# Patient Record
Sex: Male | Born: 1965 | Race: White | Hispanic: No | Marital: Single | Smoking: Current every day smoker
Health system: Southern US, Community
[De-identification: ages and names within clinical notes are randomized; demographics above are authoritative.]

## PROBLEM LIST (undated history)

## (undated) ENCOUNTER — Emergency Department (HOSPITAL_COMMUNITY): Admission: EM | Payer: Self-pay | Source: Home / Self Care

## (undated) DIAGNOSIS — E785 Hyperlipidemia, unspecified: Secondary | ICD-10-CM

## (undated) DIAGNOSIS — M792 Neuralgia and neuritis, unspecified: Secondary | ICD-10-CM

## (undated) DIAGNOSIS — R32 Unspecified urinary incontinence: Secondary | ICD-10-CM

## (undated) DIAGNOSIS — Z955 Presence of coronary angioplasty implant and graft: Secondary | ICD-10-CM

## (undated) DIAGNOSIS — M1990 Unspecified osteoarthritis, unspecified site: Secondary | ICD-10-CM

## (undated) DIAGNOSIS — I10 Essential (primary) hypertension: Secondary | ICD-10-CM

## (undated) DIAGNOSIS — K4090 Unilateral inguinal hernia, without obstruction or gangrene, not specified as recurrent: Secondary | ICD-10-CM

## (undated) DIAGNOSIS — Z720 Tobacco use: Secondary | ICD-10-CM

## (undated) DIAGNOSIS — J449 Chronic obstructive pulmonary disease, unspecified: Secondary | ICD-10-CM

## (undated) DIAGNOSIS — G8929 Other chronic pain: Secondary | ICD-10-CM

## (undated) DIAGNOSIS — I2119 ST elevation (STEMI) myocardial infarction involving other coronary artery of inferior wall: Secondary | ICD-10-CM

## (undated) DIAGNOSIS — M545 Low back pain, unspecified: Secondary | ICD-10-CM

## (undated) DIAGNOSIS — L309 Dermatitis, unspecified: Secondary | ICD-10-CM

## (undated) DIAGNOSIS — I25110 Atherosclerotic heart disease of native coronary artery with unstable angina pectoris: Secondary | ICD-10-CM

## (undated) HISTORY — PX: TRANSTHORACIC ECHOCARDIOGRAM: SHX275

## (undated) HISTORY — DX: Unilateral inguinal hernia, without obstruction or gangrene, not specified as recurrent: K40.90

## (undated) HISTORY — DX: Dermatitis, unspecified: L30.9

## (undated) HISTORY — DX: Unspecified urinary incontinence: R32

## (undated) HISTORY — DX: Chronic obstructive pulmonary disease, unspecified: J44.9

## (undated) HISTORY — PX: NO PAST SURGERIES: SHX2092

## (undated) HISTORY — DX: Neuralgia and neuritis, unspecified: M79.2

## (undated) HISTORY — DX: Low back pain, unspecified: M54.50

## (undated) HISTORY — DX: Other chronic pain: G89.29

## (undated) HISTORY — PX: OTHER SURGICAL HISTORY: SHX169

## (undated) HISTORY — DX: Unspecified osteoarthritis, unspecified site: M19.90

---

## 2014-02-17 ENCOUNTER — Emergency Department (HOSPITAL_COMMUNITY)
Admission: EM | Admit: 2014-02-17 | Discharge: 2014-02-17 | Disposition: A | Discharge status: Home / Self Care | Payer: Self-pay | Attending: Emergency Medicine | Admitting: Emergency Medicine

## 2014-02-17 ENCOUNTER — Emergency Department (HOSPITAL_COMMUNITY): Payer: Self-pay

## 2014-02-17 ENCOUNTER — Encounter (HOSPITAL_COMMUNITY): Payer: Self-pay | Admitting: Cardiology

## 2014-02-17 DIAGNOSIS — I10 Essential (primary) hypertension: Secondary | ICD-10-CM | POA: Insufficient documentation

## 2014-02-17 DIAGNOSIS — Z720 Tobacco use: Secondary | ICD-10-CM | POA: Insufficient documentation

## 2014-02-17 DIAGNOSIS — M542 Cervicalgia: Secondary | ICD-10-CM | POA: Insufficient documentation

## 2014-02-17 DIAGNOSIS — F1193 Opioid use, unspecified with withdrawal: Secondary | ICD-10-CM | POA: Insufficient documentation

## 2014-02-17 DIAGNOSIS — Z760 Encounter for issue of repeat prescription: Secondary | ICD-10-CM | POA: Insufficient documentation

## 2014-02-17 DIAGNOSIS — G8929 Other chronic pain: Secondary | ICD-10-CM | POA: Insufficient documentation

## 2014-02-17 DIAGNOSIS — F1123 Opioid dependence with withdrawal: Secondary | ICD-10-CM

## 2014-02-17 DIAGNOSIS — M25552 Pain in left hip: Secondary | ICD-10-CM | POA: Insufficient documentation

## 2014-02-17 DIAGNOSIS — R0789 Other chest pain: Secondary | ICD-10-CM | POA: Insufficient documentation

## 2014-02-17 LAB — BASIC METABOLIC PANEL
Anion gap: 7 (ref 5–15)
BUN: 20 mg/dL (ref 6–23)
CALCIUM: 9.6 mg/dL (ref 8.4–10.5)
CHLORIDE: 106 mmol/L (ref 96–112)
CO2: 24 mmol/L (ref 19–32)
CREATININE: 0.95 mg/dL (ref 0.50–1.35)
Glucose, Bld: 98 mg/dL (ref 70–99)
Potassium: 4.2 mmol/L (ref 3.5–5.1)
SODIUM: 137 mmol/L (ref 135–145)

## 2014-02-17 LAB — CBC
HCT: 44.3 % (ref 39.0–52.0)
Hemoglobin: 15.5 g/dL (ref 13.0–17.0)
MCH: 30.8 pg (ref 26.0–34.0)
MCHC: 35 g/dL (ref 30.0–36.0)
MCV: 87.9 fL (ref 78.0–100.0)
PLATELETS: 244 10*3/uL (ref 150–400)
RBC: 5.04 MIL/uL (ref 4.22–5.81)
RDW: 12.3 % (ref 11.5–15.5)
WBC: 10 10*3/uL (ref 4.0–10.5)

## 2014-02-17 LAB — I-STAT TROPONIN, ED: Troponin i, poc: 0 ng/mL (ref 0.00–0.08)

## 2014-02-17 MED ORDER — HYDROCODONE-ACETAMINOPHEN 5-325 MG PO TABS: 1.0000 | ORAL_TABLET | Freq: Four times a day (QID) | ORAL | Status: DC | PRN

## 2014-02-17 MED ORDER — AEROCHAMBER PLUS W/MASK MISC
1.0000 | Freq: Once | Status: AC
Start: 2014-02-17 — End: 2014-02-17
  Administered 2014-02-17: 1

## 2014-02-17 MED ORDER — ALBUTEROL SULFATE HFA 108 (90 BASE) MCG/ACT IN AERS
2.0000 | INHALATION_SPRAY | RESPIRATORY_TRACT | Status: DC | PRN
  Administered 2014-02-17: 2 via RESPIRATORY_TRACT
  Filled 2014-02-17: qty 6.7

## 2014-02-17 MED ORDER — HYDROCODONE-ACETAMINOPHEN 5-325 MG PO TABS
2.0000 | ORAL_TABLET | Freq: Once | ORAL | Status: AC
  Administered 2014-02-17: 2 via ORAL
  Filled 2014-02-17: qty 2

## 2014-02-17 NOTE — ED Notes (Signed)
Pt in xray

## 2014-02-17 NOTE — ED Provider Notes (Signed)
CSN: [id number]     Arrival date & time 02/17/14  1743 History   First MD Initiated Contact with Patient 02/17/14 1820     Chief Complaint  Patient presents with  . Chest Pain     (Consider location/radiation/quality/duration/timing/severity/associated sxs/prior Treatment) The history is provided by the patient and medical records. No language interpreter was used.     Damon Smith is a 49 y.o. male  with a hx of HTN, chronic neck, left hip and left knee pain presents to the Emergency Department complaining of gradual, persistent, progressively worsening agitation and anxiety onset 1 week ago.  Pt c/o associated chest pain, sharp and "zapping" in nature occuring 1-2x per night for the last 3 nights and last less than one second.  Pt reports he has been managed at a pain management clinic for 2 years, but when he went for his refills last month the clinic was closed. Patient reports he's been trying to cut back on his narcotics but now this morning. He reports significant agitation and feeling anxious.  Patient reports that he is chest pain-free at this time. He reports that his biggest concern is his neck pain, left hip pain and left knee pain, all chronic.  He denies fever, chills, headache, shortness of breath, abdominal pain, nausea, vomiting, diarrhea, weakness, dizziness, syncope, dysuria, hematuria, diaphoresis, rash.   History reviewed. No pertinent past medical history. Past Surgical History  Procedure Laterality Date  . Knee pain     History reviewed. No pertinent family history. History  Substance Use Topics  . Smoking status: Current Every Day Smoker  . Smokeless tobacco: Not on file  . Alcohol Use: No    Review of Systems  Constitutional: Negative for fever, diaphoresis, appetite change, fatigue and unexpected weight change.  HENT: Negative for mouth sores.   Eyes: Negative for visual disturbance.  Respiratory: Negative for cough, chest tightness, shortness of breath  and wheezing.   Cardiovascular: Positive for chest pain.  Gastrointestinal: Negative for nausea, vomiting, abdominal pain, diarrhea and constipation.  Endocrine: Negative for polydipsia, polyphagia and polyuria.  Genitourinary: Negative for dysuria, urgency, frequency and hematuria.  Musculoskeletal: Positive for arthralgias and neck pain. Negative for back pain and neck stiffness.  Skin: Negative for rash and wound.  Allergic/Immunologic: Negative for immunocompromised state.  Neurological: Negative for syncope, light-headedness and headaches.  Hematological: Does not bruise/bleed easily.  Psychiatric/Behavioral: Negative for sleep disturbance. The patient is not nervous/anxious.       Allergies  Review of patient's allergies indicates no known allergies.  Home Medications   Prior to Admission medications   Medication Sig Start Date End Date Taking? Authorizing Provider  HYDROcodone-acetaminophen (NORCO/VICODIN) 5-325 MG per tablet Take 1-2 tablets by mouth every 6 (six) hours as needed for moderate pain. 02/17/14   Eathel Pajak, PA-C   BP 126/77 mmHg  Pulse 72  Temp(Src) 98.3 F (36.8 C) (Oral)  Resp 20  Ht 5\' 11"  (1.803 m)  Wt 201 lb (91.173 kg)  BMI 28.05 kg/m2  SpO2 97% Physical Exam  Constitutional: He appears well-developed and well-nourished. No distress.  Awake, alert, nontoxic appearance  HENT:  Head: Normocephalic and atraumatic.  Mouth/Throat: Oropharynx is clear and moist. No oropharyngeal exudate.  Eyes: Conjunctivae are normal. No scleral icterus.  Neck: Normal range of motion. Neck supple.  Mild bilateral paraspinal tenderness Full range of motion with minimal pain No midline tenderness, step-offs or deformities  Cardiovascular: Normal rate, regular rhythm, normal heart sounds and intact distal pulses.  No murmur heard. Capillary refill < 3 sec  Pulmonary/Chest: Effort normal and breath sounds normal. No respiratory distress. He has no wheezes. He  exhibits no tenderness.  Equal chest expansion Clear and equal breath sounds No tenderness to palpation of the chest  Abdominal: Soft. Bowel sounds are normal. He exhibits no distension and no mass. There is no tenderness. There is no rebound and no guarding.  Soft and nontender  Musculoskeletal: Normal range of motion. He exhibits tenderness. He exhibits no edema.  Full range of motion of bilateral hips knees and ankles Normal gait  Neurological: He is alert. Coordination normal.  Speech is clear and goal oriented Moves extremities without ataxia Sensation intact to bilateral upper and lower extremities Strength 5/5 in bilateral upper and lower extremities with resisted flexion and extension of all major joints  Skin: Skin is warm and dry. He is not diaphoretic.  No tenting of the skin  Psychiatric: He has a normal mood and affect.  Nursing note and vitals reviewed.   ED Course  Procedures (including critical care time) Labs Review Labs Reviewed  CBC  BASIC METABOLIC PANEL  Randolm Idol, ED    Imaging Review Dg Chest 2 View  02/17/2014   CLINICAL DATA:  Chest pain. Pt says he does not have chest pain now but does at night. Dizziness on standing. Pt is a smoker; 1 1/2 packs /day. Hx HTN.  EXAM: CHEST  2 VIEW  COMPARISON:  None.  FINDINGS: The heart size and mediastinal contours are within normal limits. Both lungs are clear. No pleural effusion or pneumothorax. The visualized skeletal structures are unremarkable.  IMPRESSION: No active cardiopulmonary disease.   Electronically Signed   By: Lajean Manes M.D.   On: 02/17/2014 19:00     EKG Interpretation None       ECG:  Date: 02/18/2014  Rate: 95  Rhythm: normal sinus rhythm  QRS Axis: left  Intervals: normal  ST/T Wave abnormalities: normal  Conduction Disutrbances:none  Narrative Interpretation: Nonischemic ECG, no old for comparison  Old EKG Reviewed: none available    MDM   Final diagnoses:  Narcotic  withdrawal  Medication refill  Atypical chest pain  Arthralgia of left hip   [name] presents with narcotic withdrawal symptoms including agitation and atypical chest pain.  Chest pain is not likely of cardiac or pulmonary etiology d/t presentation, PERC negative, VSS, no tracheal deviation, no JVD or new murmur, RRR, breath sounds equal bilaterally, EKG without acute abnormalities, negative troponin, and negative CXR.   Patient without abdominal symptoms from his narcotic withdrawal.  His pain has been treated here in the emergency department and he reports he is feeling much better. He is without tachycardia or hypertension.  He has been given resources for primary care physician and pain management.  Pt has been advised to return to the ED if CP becomes exertional, associated with diaphoresis or nausea, radiates to left jaw/arm, worsens or becomes concerning in any way. Pt appears reliable for follow up and is agreeable to discharge.   Case has been discussed with Dr. Thurnell Garbe who agrees with the above plan to discharge.   I have personally reviewed patient's vitals, nursing note and any pertinent labs or imaging.  I performed an undressed physical exam.    It has been determined that no acute conditions requiring further emergency intervention are present at this time. The patient/guardian have been advised of the diagnosis and plan. I reviewed all labs and imaging including any  potential incidental findings. We have discussed signs and symptoms that warrant return to the ED and they are listed in the discharge instructions.    Vital signs are stable at discharge.   BP 126/77 mmHg  Pulse 72  Temp(Src) 98.3 F (36.8 C) (Oral)  Resp 20  Ht 5\' 11"  (1.803 m)  Wt 201 lb (91.173 kg)  BMI 28.05 kg/m2  SpO2 97%     Abigail Butts, PA-C 02/18/14 [place], DO 02/19/14 1420

## 2014-02-17 NOTE — Discharge Instructions (Signed)
1. Medications: Norco, usual home medications 2. Treatment: rest, drink plenty of fluids,  3. Follow Up: Please followup with the [facility] in 3 days for discussion of your diagnoses and further evaluation after today's visit; if you do not have a primary care doctor use the resource guide provided to find one; Please return to the ER for return of chest pain, SOB, nausea, weakness, syncope or other concerns.  Please also follow-up with the listed pain clinic or use the resource guide to find a pain management center.   Emergency Department Resource Guide 1) Find a Doctor and Pay Out of Pocket Although you won't have to find out who is covered by your insurance plan, it is a good idea to ask around and get recommendations. You will then need to call the office and see if the doctor you have chosen will accept you as a new patient and what types of options they offer for patients who are self-pay. Some doctors offer discounts or will set up payment plans for their patients who do not have insurance, but you will need to ask so you aren't surprised when you get to your appointment.  2) Contact Your Local Health Department Not all health departments have doctors that can see patients for sick visits, but many do, so it is worth a call to see if yours does. If you don't know where your local health department is, you can check in your phone book. The CDC also has a tool to help you locate your state's health department, and many state websites also have listings of all of their local health departments.  3) Find a [place] [facility] If your illness is not likely to be very severe or complicated, you may want to try a walk in clinic. These are popping up all over the country in pharmacies, drugstores, and shopping centers. They're usually staffed by nurse practitioners or physician assistants that have been trained to treat common illnesses and complaints. They're usually fairly quick and inexpensive.  However, if you have serious medical issues or chronic medical problems, these are probably not your best option.  No Primary Care Doctor: - Call Health Connect at  [phone] - they can help you locate a primary care doctor that  accepts your insurance, provides certain services, etc. - Physician Referral Service- [phone]  Chronic Pain Problems: Organization         Address  Phone   Notes  [facility]  [phone] Patients need to be referred by their primary care doctor.   Medication Assistance: Organization         Address  Phone   Notes  [place] [facility] [address] [place], [place] [address] [phone] --Must be a resident of [facility] -- Must have NO insurance coverage whatsoever (no Medicaid/ Medicare, etc.) -- The pt. MUST have a primary care doctor that directs their care regularly and follows them in the community   MedAssist  [phone]   [employer]  [phone]    Agencies that provide inexpensive medical care: Organization         Address  Phone   Notes  [place]  [phone]   [facility]    [phone]   [facility] [place], [place] [address] [phone]   [place] [address] [place] [phone]   Planned  Parenthood    [phone]   [facility]    [phone]   Community Health and [place] [address], [place] Phone:  [phone], Fax:  [phone] Hours of Operation:  9 am - 6 pm, M-F.  Also accepts Medicaid/Medicare and self-pay.  [facility] for [place], [address], [place] Phone: [phone], Fax: [phone]. Hours of Operation:  8:30 am - 5:30 pm, M-F.  Also accepts Medicaid and self-pay.  [facility] [place] [address], [place] Phone: [phone]   [place], [place], [place] [phone], Ext. [phone] Mondays & Thursdays: 7-9 AM.  First 15 patients are seen on a first come, first serve basis.    [employer] Providers:  Organization         Address  Phone   Notes  [facility] [address], [place] [phone] Also accepts self-pay patients.  [facility] [address] [place], [place]  [phone]   [place], [address], [place] [phone]   [facility] [address] [place] [phone]   Lucianne Lei [address], [place]   [phone] Only accepts [place] Access [place] patients after they have their name applied to their card.   Self-Pay (no insurance) in [facility]:  Organization         Address  Phone   Notes  Sickle Cell Patients, [facility] [place] [phone]   [facility] [place] [phone]   [facility] [place]  [place], [place], [place] [phone]   [facility]/Dr. Osei-Bonsu  [address] [place] or [address], [place] [phone] Phone number for both [place] and [place] locations is the same.  Urgent Medical and [facility] [address], [place] [phone]   [facility] [address] [place] or [address] [phone]   [facility] [address], [place] [phone], phone; [phone], fax Sees patients 1st and 3rd Saturday of every month.  Must not qualify for public or private insurance (i.e. Medicaid, Medicare, [place] Health Choice, Veterans' Benefits)  Household income should be no more than 200% of the poverty level The clinic cannot treat you if you are pregnant or think  you are pregnant  Sexually transmitted diseases are not treated at the clinic.    Dental Care: Organization         Address  Phone  Notes  [facility] of [facility] [place] [phone] Accepts children up to age 44 who are enrolled in [place] or [place]; pregnant women with a Medicaid card; and children who have applied for Medicaid or [place] Health Choice, but were declined, whose parents can pay a reduced fee at time of service.  [facility] Department of [facility]  [address], [place] [phone] Accepts children up to age 23 who are enrolled in [place] or [place]; pregnant women with a Medicaid card; and children who have applied for Medicaid or [place], but were declined, whose  parents can pay a reduced fee at time of service.  [place] Adult Dental Access PROGRAM  [place] [phone] Patients are seen by appointment only. Walk-ins are not accepted. [place] will see patients 33 years of age and older. Monday - Tuesday (8am-5pm) Most Wednesdays (8:30-5pm) $30 per visit, cash only  [facility] Adult Dental Access PROGRAM  [address], [facility] [phone] Patients are seen by appointment only. Walk-ins are not accepted. [place] will see patients 36 years of age and older. One Wednesday Evening (Monthly: Volunteer Based).  $30 per visit, cash only  [place]  [phone] for adults; Children under age 3, call Graduate Pediatric Dentistry at [phone]. Children aged 67-14, please call [phone] to request a pediatric application.  Dental services are provided in all areas of dental care including fillings, crowns and bridges, complete and partial dentures, implants, gum treatment, root canals, and extractions. Preventive care is also provided. Treatment is provided to both adults and  children. Patients are selected via a lottery and there is often a waiting list.   [facility] [address], [place]  [phone] [email or web address]   [facility] [address] [place], [place] [phone], Ext. [phone] Second and Fourth Thursday of each month, opens at 6:30 AM; Clinic ends at 9 AM.  Patients are seen on a first-come first-served basis, and a limited number are seen during each clinic.   [facility]  [address] [place], [place] [phone]   Eligibility Requirements You must have lived in [place], [place], or [place] counties for at least the last three months.   You cannot be eligible for state or federal sponsored [employer], including [employer], [place], or [employer].   You generally cannot be eligible for healthcare insurance through your employer.    How to apply: Eligibility screenings are held every Tuesday and Wednesday afternoon from 1:00 pm until 4:00 pm. You do not need an appointment for the interview!  [facility] [address] [place], [place]   [place]  [place] Department  [place]  [phone]    Behavioral Health Resources in the Community: Intensive Outpatient Programs Organization         Address  Phone  Notes  [place]. [address], [place], [place] [phone]   [facility] [address] [place], [place]   ADS: Alcohol & Drug Svcs [address] [place], [place]   [place] [address]  [place], [place] or [phone]   Substance Abuse Resources Organization         Address  Phone  Notes  Alcohol and Drug Services  [phone]   [place]  [phone]   [facility] [place]     Chinita Pester  [phone]   Residential & Outpatient Substance Abuse Program  [phone]   Psychological Services Organization         Address  Phone  Notes  [facility] [place]  [place]  [phone]   [place] [address], [place] or [phone]    Mobile Crisis Teams Organization         Address  Phone  Notes  Therapeutic Alternatives, Mobile  Crisis Care Unit  [phone]   Assertive Psychotherapeutic Services  [address] [place], [place]   [facility] [address], [place] [phone]    Self-Help/Support Groups Organization         Address  Phone             Notes  [place]. of [place] - variety of support groups  [place] Call for more information  Narcotics Anonymous (NA), [facility] [address], [employer] [place]  2 meetings at this location   Special educational needs teacher         Address  Phone  Notes  ASAP Residential Treatment [place],    [place]  [phone]   [facility]  [address], [place] [phone], [place], [place]   [place], [place] [phone] Admissions: 8am-3pm M-F  Incentives Substance [place] [address]    [place], [place] [phone]   [facility] [address] [place], [place], [place]   [facility] [address],  [place], [place]   [facility] Programs - Intensive Outpatient [address], [place], [place]   [facility].,  [place], [place] [phone] or [phone]   [facility] Services (RTS) [address], [place], [place] Accepts Medicaid  [facility] [place] [address]  [place] [phone] Substance Abuse/Addiction Treatment   [facility] Organization         Address  Phone  Notes  [facility]  [phone]   Domenic Schwab, PhD [address] [place], [place]   [phone] or [phone]   [place], [place] [phone]   [facility] [address] [place], [place] [phone] Insurance/Medicaid/sponsorship through [facility] and Families [address], [address] [place]                                    [place], [place] [phone] [place] [address], [place] [phone]    Dr. Adele Schilder  [phone]   [facility] [place] Dept. 1) [address], [place] 2) [place] 3)  [place] [address], [place] [phone]  [phone]   [place] [phone] or [phone] (After Hours)

## 2014-02-17 NOTE — ED Notes (Signed)
DISCHARGED VITALS COMPLETE.

## 2014-02-17 NOTE — ED Notes (Signed)
Pt reports some chest pain and anxiety over the past couple of evenings. Reports that he has been out of his pain medication for the past couple of days and thinks that may have been contributing to the anxiety.

## 2014-10-16 ENCOUNTER — Inpatient Hospital Stay (HOSPITAL_COMMUNITY)
Admission: EM | Admit: 2014-10-16 | Discharge: 2014-10-18 | DRG: 247 | Disposition: A | Discharge status: Home / Self Care | Payer: Self-pay | Source: Intra-hospital | Attending: Interventional Cardiology | Admitting: Interventional Cardiology

## 2014-10-16 ENCOUNTER — Encounter (HOSPITAL_COMMUNITY)
Admission: EM | Disposition: A | Discharge status: Home / Self Care | Payer: Self-pay | Attending: Interventional Cardiology

## 2014-10-16 ENCOUNTER — Encounter (HOSPITAL_COMMUNITY): Payer: Self-pay | Admitting: Physician Assistant

## 2014-10-16 DIAGNOSIS — Z8249 Family history of ischemic heart disease and other diseases of the circulatory system: Secondary | ICD-10-CM

## 2014-10-16 DIAGNOSIS — I25110 Atherosclerotic heart disease of native coronary artery with unstable angina pectoris: Secondary | ICD-10-CM | POA: Diagnosis present

## 2014-10-16 DIAGNOSIS — I10 Essential (primary) hypertension: Secondary | ICD-10-CM | POA: Diagnosis present

## 2014-10-16 DIAGNOSIS — F17210 Nicotine dependence, cigarettes, uncomplicated: Secondary | ICD-10-CM | POA: Diagnosis present

## 2014-10-16 DIAGNOSIS — Z716 Tobacco abuse counseling: Secondary | ICD-10-CM | POA: Diagnosis present

## 2014-10-16 DIAGNOSIS — I25119 Atherosclerotic heart disease of native coronary artery with unspecified angina pectoris: Secondary | ICD-10-CM | POA: Clinically undetermined

## 2014-10-16 DIAGNOSIS — Z720 Tobacco use: Secondary | ICD-10-CM | POA: Diagnosis present

## 2014-10-16 DIAGNOSIS — I2510 Atherosclerotic heart disease of native coronary artery without angina pectoris: Secondary | ICD-10-CM

## 2014-10-16 DIAGNOSIS — I2111 ST elevation (STEMI) myocardial infarction involving right coronary artery: Secondary | ICD-10-CM

## 2014-10-16 DIAGNOSIS — I2119 ST elevation (STEMI) myocardial infarction involving other coronary artery of inferior wall: Principal | ICD-10-CM | POA: Diagnosis present

## 2014-10-16 DIAGNOSIS — Z955 Presence of coronary angioplasty implant and graft: Secondary | ICD-10-CM

## 2014-10-16 DIAGNOSIS — G8929 Other chronic pain: Secondary | ICD-10-CM | POA: Diagnosis present

## 2014-10-16 DIAGNOSIS — E785 Hyperlipidemia, unspecified: Secondary | ICD-10-CM | POA: Diagnosis present

## 2014-10-16 HISTORY — DX: Hyperlipidemia, unspecified: E78.5

## 2014-10-16 HISTORY — DX: Tobacco use: Z72.0

## 2014-10-16 HISTORY — DX: ST elevation (STEMI) myocardial infarction involving other coronary artery of inferior wall: I21.19

## 2014-10-16 HISTORY — DX: Presence of coronary angioplasty implant and graft: Z95.5

## 2014-10-16 HISTORY — DX: Other chronic pain: G89.29

## 2014-10-16 HISTORY — DX: Essential (primary) hypertension: I10

## 2014-10-16 HISTORY — DX: Atherosclerotic heart disease of native coronary artery with unstable angina pectoris: I25.110

## 2014-10-16 HISTORY — PX: CARDIAC CATHETERIZATION: SHX172

## 2014-10-16 LAB — POCT I-STAT, CHEM 8
BUN: 9 mg/dL (ref 6–20)
CALCIUM ION: 1.16 mmol/L (ref 1.12–1.23)
CREATININE: 0.4 mg/dL — AB (ref 0.61–1.24)
Chloride: 79 mmol/L — ABNORMAL LOW (ref 101–111)
GLUCOSE: 67 mg/dL (ref 65–99)
HCT: 37 % — ABNORMAL LOW (ref 39.0–52.0)
HEMOGLOBIN: 12.6 g/dL — AB (ref 13.0–17.0)
Potassium: 3.2 mmol/L — ABNORMAL LOW (ref 3.5–5.1)
Sodium: 115 mmol/L — CL (ref 135–145)
TCO2: 17 mmol/L (ref 0–100)

## 2014-10-16 LAB — CBC
HEMATOCRIT: 40.7 % (ref 39.0–52.0)
HEMOGLOBIN: 13.9 g/dL (ref 13.0–17.0)
MCH: 30.6 pg (ref 26.0–34.0)
MCHC: 34.2 g/dL (ref 30.0–36.0)
MCV: 89.6 fL (ref 78.0–100.0)
Platelets: 260 10*3/uL (ref 150–400)
RBC: 4.54 MIL/uL (ref 4.22–5.81)
RDW: 12.5 % (ref 11.5–15.5)
WBC: 11.5 10*3/uL — ABNORMAL HIGH (ref 4.0–10.5)

## 2014-10-16 LAB — POCT ACTIVATED CLOTTING TIME: ACTIVATED CLOTTING TIME: 460 s

## 2014-10-16 LAB — CREATININE, SERUM
Creatinine, Ser: 0.96 mg/dL (ref 0.61–1.24)
GFR calc Af Amer: >60 mL/min (ref >60)

## 2014-10-16 LAB — MRSA PCR SCREENING: MRSA by PCR: NEGATIVE

## 2014-10-16 LAB — TROPONIN I: Troponin I: 1.37 ng/mL (ref <0.031)

## 2014-10-16 SURGERY — LEFT HEART CATH AND CORONARY ANGIOGRAPHY
Anesthesia: LOCAL

## 2014-10-16 MED ORDER — NITROGLYCERIN 1 MG/10 ML FOR IR/CATH LAB
INTRA_ARTERIAL | Status: AC
  Filled 2014-10-16: qty 10

## 2014-10-16 MED ORDER — SODIUM CHLORIDE 0.9 % IJ SOLN
3.0000 mL | Freq: Two times a day (BID) | INTRAMUSCULAR | Status: DC
  Administered 2014-10-16 – 2014-10-17 (×3): 3 mL via INTRAVENOUS

## 2014-10-16 MED ORDER — LIDOCAINE HCL (PF) 1 % IJ SOLN
INTRAMUSCULAR | Status: DC | PRN
  Administered 2014-10-16: 5 mL

## 2014-10-16 MED ORDER — INFLUENZA VAC SPLIT QUAD 0.5 ML IM SUSY
0.5000 mL | PREFILLED_SYRINGE | INTRAMUSCULAR | Status: DC
  Filled 2014-10-16: qty 0.5

## 2014-10-16 MED ORDER — NITROGLYCERIN 0.4 MG SL SUBL: 0.4000 mg | SUBLINGUAL_TABLET | SUBLINGUAL | Status: DC | PRN

## 2014-10-16 MED ORDER — BIVALIRUDIN BOLUS VIA INFUSION - CUPID
INTRAVENOUS | Status: DC | PRN
  Administered 2014-10-16: 68.25 mg via INTRAVENOUS

## 2014-10-16 MED ORDER — VERAPAMIL HCL 2.5 MG/ML IV SOLN
INTRAVENOUS | Status: DC | PRN
  Administered 2014-10-16: 10 mL via INTRA_ARTERIAL

## 2014-10-16 MED ORDER — LIDOCAINE HCL (PF) 1 % IJ SOLN
INTRAMUSCULAR | Status: AC
  Filled 2014-10-16: qty 30

## 2014-10-16 MED ORDER — ACETAMINOPHEN 325 MG PO TABS: 650.0000 mg | ORAL_TABLET | ORAL | Status: DC | PRN

## 2014-10-16 MED ORDER — ENOXAPARIN SODIUM 40 MG/0.4ML ~~LOC~~ SOLN
40.0000 mg | SUBCUTANEOUS | Status: DC
  Administered 2014-10-17: 40 mg via SUBCUTANEOUS
  Filled 2014-10-16: qty 0.4

## 2014-10-16 MED ORDER — OXYCODONE-ACETAMINOPHEN 5-325 MG PO TABS
1.0000 | ORAL_TABLET | ORAL | Status: DC | PRN
  Administered 2014-10-16: 2 via ORAL
  Filled 2014-10-16: qty 2

## 2014-10-16 MED ORDER — FENTANYL CITRATE (PF) 100 MCG/2ML IJ SOLN
INTRAMUSCULAR | Status: AC
  Filled 2014-10-16: qty 4

## 2014-10-16 MED ORDER — SODIUM CHLORIDE 0.9 % WEIGHT BASED INFUSION: 1.0000 mL/kg/h | INTRAVENOUS | Status: AC

## 2014-10-16 MED ORDER — NITROGLYCERIN 1 MG/10 ML FOR IR/CATH LAB
INTRA_ARTERIAL | Status: DC | PRN
  Administered 2014-10-16: 100 ug via INTRACORONARY

## 2014-10-16 MED ORDER — HYDROCODONE-ACETAMINOPHEN 5-325 MG PO TABS
1.0000 | ORAL_TABLET | Freq: Four times a day (QID) | ORAL | Status: DC | PRN
  Administered 2014-10-16 – 2014-10-18 (×7): 2 via ORAL
  Filled 2014-10-16 (×7): qty 2

## 2014-10-16 MED ORDER — ATORVASTATIN CALCIUM 80 MG PO TABS
80.0000 mg | ORAL_TABLET | Freq: Every day | ORAL | Status: DC
  Administered 2014-10-17: 80 mg via ORAL
  Filled 2014-10-16: qty 1

## 2014-10-16 MED ORDER — HEPARIN SODIUM (PORCINE) 1000 UNIT/ML IJ SOLN
INTRAMUSCULAR | Status: AC
  Filled 2014-10-16: qty 1

## 2014-10-16 MED ORDER — ATORVASTATIN CALCIUM 80 MG PO TABS: 80.0000 mg | ORAL_TABLET | Freq: Every day | ORAL | Status: DC

## 2014-10-16 MED ORDER — NITROGLYCERIN IN D5W 200-5 MCG/ML-% IV SOLN
INTRAVENOUS | Status: DC | PRN
  Administered 2014-10-16: 10 ug/min via INTRAVENOUS

## 2014-10-16 MED ORDER — TICAGRELOR 90 MG PO TABS
ORAL_TABLET | ORAL | Status: DC | PRN
  Administered 2014-10-16: 180 mg via ORAL

## 2014-10-16 MED ORDER — SODIUM CHLORIDE 0.9 % IV SOLN
250.0000 mg | INTRAVENOUS | Status: DC | PRN
  Administered 2014-10-16: 1.75 mg/kg/h via INTRAVENOUS

## 2014-10-16 MED ORDER — ONDANSETRON HCL 4 MG/2ML IJ SOLN: 4.0000 mg | Freq: Four times a day (QID) | INTRAMUSCULAR | Status: DC | PRN

## 2014-10-16 MED ORDER — MIDAZOLAM HCL 2 MG/2ML IJ SOLN
INTRAMUSCULAR | Status: DC | PRN
  Administered 2014-10-16 (×2): 1 mg via INTRAVENOUS

## 2014-10-16 MED ORDER — PNEUMOCOCCAL VAC POLYVALENT 25 MCG/0.5ML IJ INJ
0.5000 mL | INJECTION | INTRAMUSCULAR | Status: AC
  Administered 2014-10-17: 0.5 mL via INTRAMUSCULAR
  Filled 2014-10-16: qty 0.5

## 2014-10-16 MED ORDER — NITROGLYCERIN IN D5W 200-5 MCG/ML-% IV SOLN: 20.0000 ug/min | INTRAVENOUS | Status: AC

## 2014-10-16 MED ORDER — SODIUM CHLORIDE 0.9 % IV SOLN: 250.0000 mL | INTRAVENOUS | Status: DC | PRN

## 2014-10-16 MED ORDER — NITROGLYCERIN IN D5W 200-5 MCG/ML-% IV SOLN
INTRAVENOUS | Status: AC
  Filled 2014-10-16: qty 250

## 2014-10-16 MED ORDER — MIDAZOLAM HCL 2 MG/2ML IJ SOLN
INTRAMUSCULAR | Status: AC
  Filled 2014-10-16: qty 4

## 2014-10-16 MED ORDER — TICAGRELOR 90 MG PO TABS
90.0000 mg | ORAL_TABLET | Freq: Two times a day (BID) | ORAL | Status: DC
  Administered 2014-10-17 – 2014-10-18 (×3): 90 mg via ORAL
  Filled 2014-10-16 (×4): qty 1

## 2014-10-16 MED ORDER — VERAPAMIL HCL 2.5 MG/ML IV SOLN
INTRAVENOUS | Status: AC
  Filled 2014-10-16: qty 2

## 2014-10-16 MED ORDER — METOPROLOL TARTRATE 12.5 MG HALF TABLET
12.5000 mg | ORAL_TABLET | Freq: Two times a day (BID) | ORAL | Status: DC
  Administered 2014-10-16: 12.5 mg via ORAL
  Filled 2014-10-16: qty 1

## 2014-10-16 MED ORDER — IOHEXOL 350 MG/ML SOLN
INTRAVENOUS | Status: DC | PRN
  Administered 2014-10-16: 165 mL via INTRAVENOUS

## 2014-10-16 MED ORDER — ASPIRIN EC 81 MG PO TBEC
81.0000 mg | DELAYED_RELEASE_TABLET | Freq: Every day | ORAL | Status: DC
  Administered 2014-10-17 – 2014-10-18 (×2): 81 mg via ORAL
  Filled 2014-10-16 (×2): qty 1

## 2014-10-16 MED ORDER — BIVALIRUDIN 250 MG IV SOLR
INTRAVENOUS | Status: AC
  Filled 2014-10-16: qty 250

## 2014-10-16 MED ORDER — ASPIRIN 81 MG PO CHEW: 81.0000 mg | CHEWABLE_TABLET | Freq: Every day | ORAL | Status: DC

## 2014-10-16 MED ORDER — FENTANYL CITRATE (PF) 100 MCG/2ML IJ SOLN
INTRAMUSCULAR | Status: DC | PRN
  Administered 2014-10-16 (×2): 50 ug via INTRAVENOUS

## 2014-10-16 MED ORDER — SODIUM CHLORIDE 0.9 % IJ SOLN
3.0000 mL | INTRAMUSCULAR | Status: DC | PRN
  Administered 2014-10-17: 3 mL via INTRAVENOUS
  Filled 2014-10-16: qty 3

## 2014-10-16 SURGICAL SUPPLY — 16 items
BALLN EMERGE MR 3.0X15 (BALLOONS) ×2
BALLN ~~LOC~~ EUPHORA RX 4.5X15 (BALLOONS) ×2
BALLOON EMERGE MR 3.0X15 (BALLOONS) IMPLANT
BALLOON ~~LOC~~ EUPHORA RX 4.5X15 (BALLOONS) IMPLANT
CATH INFINITI 5 FR JL3.5 (CATHETERS) ×2 IMPLANT
DEVICE RAD COMP TR BAND LRG (VASCULAR PRODUCTS) ×2 IMPLANT
GLIDESHEATH SLEND A-KIT 6F 22G (SHEATH) ×2 IMPLANT
GUIDE CATH RUNWAY 6FR FR4 (CATHETERS) ×1 IMPLANT
KIT ENCORE 26 ADVANTAGE (KITS) ×1 IMPLANT
KIT HEART LEFT (KITS) ×2 IMPLANT
PACK CARDIAC CATHETERIZATION (CUSTOM PROCEDURE TRAY) ×2 IMPLANT
STENT SYNERGY DES 4X28 (Permanent Stent) ×1 IMPLANT
TRANSDUCER W/STOPCOCK (MISCELLANEOUS) ×2 IMPLANT
TUBING CIL FLEX 10 FLL-RA (TUBING) ×2 IMPLANT
WIRE ASAHI PROWATER 180CM (WIRE) ×1 IMPLANT
WIRE SAFE-T 1.5MM-J .035X260CM (WIRE) ×2 IMPLANT

## 2014-10-16 NOTE — Progress Notes (Signed)
CRITICAL VALUE ALERT  Critical value received:  Troponin 1.37  Date of notification:  10/16/2014   Time of notification:  7:53 PM   Critical value read back:Yes.    Nurse who received alert:  Johnsie Cancel   MD notified (1st page):  Expected value, MD aware  Time of first page:    MD notified (2nd page):  Time of second page:  Responding MD:    Time MD responded:

## 2014-10-16 NOTE — H&P (Signed)
Patient ID: Damon Smith MRN: [id number], DOB/AGE: 47-Mar-1967   Admit date: 10/16/2014  Primary Physician: No primary care provider on file. Primary Cardiologist: Dr. Tamala Julian  Pt. Profile:  49 year old Caucasian male with past medical history of hypertension and chronic knee pain, but no prior cardiac history presented as inferior STEMI.  Problem List  Past Medical History  Diagnosis Date  . Hypertension     Past Surgical History  Procedure Laterality Date  . Knee pain    . No past surgeries       Allergies  No Known Allergies  HPI  The patient is a 50 year old Caucasian male with past medical history of hypertension and chronic knee pain, but no prior cardiac history. His father had first MI in his 77s. He has been smoking 2 packs a day. He quit drinking 3 years ago. He denies any illicit drug use.   He presented to [name] on 10/16/2014 as acute inferior STEMI. Per patient, he has been having intermittent substernal chest discomfort for the past 5-6 days which he thought was indigestion. The chest discomfort increasing frequency starting on 9/26. He states he had a roughly 7-8 episodes of chest discomfort throughout the night. In the afternoon of 9/27, while he was lifting heavy object in the house he started having significant amount of chest discomfort and nausea which prompted the patient to seek medical attention. He arrived at [facility] 32min later. En route to [facility], he received 6 mg morphine, 3 nitroglycerin, 4 baby aspirin. Of note, patient denies any history of significant bleeding or stroke. He did have some black stools yesterday after taking Pepto-Bismol.  On arrival to the hospital, EKG showed ST elevation in the inferior lead with corresponding ST depression in the septal lead. He was taken straight to the [facility] cath lab. On arrival, he still have about 3 out of 10 chest pain. It was felt that his black stool was likely related to   Pepto-Bismol use, he has no obvious contraindication to drug eluding stent. Labs currently pending  Home Medications  Prior to Admission medications   Medication Sig Start Date End Date Taking? Authorizing Provider  HYDROcodone-acetaminophen (NORCO/VICODIN) 5-325 MG per tablet Take 1-2 tablets by mouth every 6 (six) hours as needed for moderate pain. 02/17/14   Jarrett Soho Muthersbaugh, PA-C    Family History  Family History  Problem Relation Age of Onset  . Heart attack Father 51    Social History  Social History   Social History  . Marital Status: Single    Spouse Name: N/A  . Number of Children: N/A  . Years of Education: N/A   Occupational History  . Not on file.   Social History Main Topics  . Smoking status: Current Every Day Smoker  . Smokeless tobacco: Not on file     Comment: 2 ppd  . Alcohol Use: No  . Drug Use: No  . Sexual Activity: Not on file   Other Topics Concern  . Not on file   Social History Narrative     Review of Systems General:  No chills, fever, night sweats or weight changes.  Cardiovascular:  No dyspnea on exertion, edema, orthopnea, palpitations, paroxysmal nocturnal dyspnea. +chest pain Dermatological: No rash, lesions/masses Respiratory: No cough, dyspnea Urologic: No hematuria, dysuria Abdominal:   No vomiting, diarrhea, bright red blood per rectum, melena, or hematemesis +nausea Neurologic:  No visual changes, wkns, changes in mental status. All other systems reviewed and are  otherwise negative except as noted above.  Physical Exam  There were no vitals taken for this visit.  General: Pleasant, NAD Psych: Normal affect. Neuro: Alert and oriented X 3. Moves all extremities spontaneously. HEENT: Normal  Neck: Supple without bruits or JVD. Lungs:  Resp regular and unlabored, CTA. Heart: RRR no s3, s4, or murmurs. Abdomen: Soft, non-tender, non-distended, BS + x 4.  Extremities: No clubbing, cyanosis or edema. DP/PT/Radials 2+ and  equal bilaterally.  Labs  Troponin (Point of Care Test) No results for input(s): TROPIPOC in the last 72 hours. No results for input(s): CKTOTAL, CKMB, TROPONINI in the last 72 hours. Lab Results  Component Value Date   WBC 10.0 02/17/2014   HGB 15.5 02/17/2014   HCT 44.3 02/17/2014   MCV 87.9 02/17/2014   PLT 244 02/17/2014   No results for input(s): NA, K, CL, CO2, BUN, CREATININE, CALCIUM, PROT, BILITOT, ALKPHOS, ALT, AST, GLUCOSE in the last 168 hours.  Invalid input(s): LABALBU No results found for: CHOL, HDL, LDLCALC, TRIG No results found for: DDIMER    ECG  Greater than 2 mm ST elevation in the inferior lead with corresponding ST depression in the septal anterior lead.   ASSESSMENT AND PLAN  1. Inferior STEMI  - Patient undergo emergent cardiac catheterization, no obvious contraindication to drug-eluting stent, his black stool was felt to be related to Pepto-Bismol use   - admit to ICU, septal enzymes, obtain echocardiogram.   - Obtain lipid panel and hgb A1c in the morning.   2. HTN   Signed, Almyra Deforest, PA-C 10/16/2014, 5:38 PM&p

## 2014-10-17 ENCOUNTER — Encounter (HOSPITAL_COMMUNITY): Payer: Self-pay | Admitting: Interventional Cardiology

## 2014-10-17 ENCOUNTER — Ambulatory Visit (HOSPITAL_COMMUNITY): Payer: Self-pay

## 2014-10-17 DIAGNOSIS — I10 Essential (primary) hypertension: Secondary | ICD-10-CM

## 2014-10-17 DIAGNOSIS — I25119 Atherosclerotic heart disease of native coronary artery with unspecified angina pectoris: Secondary | ICD-10-CM | POA: Clinically undetermined

## 2014-10-17 DIAGNOSIS — I2119 ST elevation (STEMI) myocardial infarction involving other coronary artery of inferior wall: Secondary | ICD-10-CM

## 2014-10-17 DIAGNOSIS — Z955 Presence of coronary angioplasty implant and graft: Secondary | ICD-10-CM

## 2014-10-17 DIAGNOSIS — I25110 Atherosclerotic heart disease of native coronary artery with unstable angina pectoris: Secondary | ICD-10-CM

## 2014-10-17 HISTORY — DX: Atherosclerotic heart disease of native coronary artery with unstable angina pectoris: I25.110

## 2014-10-17 HISTORY — DX: Presence of coronary angioplasty implant and graft: Z95.5

## 2014-10-17 HISTORY — DX: Essential (primary) hypertension: I10

## 2014-10-17 LAB — BASIC METABOLIC PANEL
ANION GAP: 6 (ref 5–15)
Anion gap: 10 (ref 5–15)
BUN: 8 mg/dL (ref 6–20)
BUN: 6 mg/dL (ref 6–20)
CALCIUM: 8.8 mg/dL — AB (ref 8.9–10.3)
CALCIUM: 8.7 mg/dL — AB (ref 8.9–10.3)
CO2: 21 mmol/L — ABNORMAL LOW (ref 22–32)
CO2: 25 mmol/L (ref 22–32)
CREATININE: 0.92 mg/dL (ref 0.61–1.24)
Chloride: 104 mmol/L (ref 101–111)
Chloride: 104 mmol/L (ref 101–111)
Creatinine, Ser: 0.94 mg/dL (ref 0.61–1.24)
GFR calc Af Amer: >60 mL/min (ref >60)
GLUCOSE: 107 mg/dL — AB (ref 65–99)
Glucose, Bld: 107 mg/dL — ABNORMAL HIGH (ref 65–99)
POTASSIUM: 3.6 mmol/L (ref 3.5–5.1)
Potassium: 3.6 mmol/L (ref 3.5–5.1)
SODIUM: 135 mmol/L (ref 135–145)
Sodium: 135 mmol/L (ref 135–145)

## 2014-10-17 LAB — TROPONIN I
Troponin I: 6.7 ng/mL (ref <0.031)
Troponin I: 5.77 ng/mL (ref <0.031)
Troponin I: 3.8 ng/mL (ref <0.031)
Troponin I: 3.69 ng/mL (ref <0.031)

## 2014-10-17 LAB — CBC
HEMATOCRIT: 38 % — AB (ref 39.0–52.0)
HEMOGLOBIN: 12.5 g/dL — AB (ref 13.0–17.0)
MCH: 29.8 pg (ref 26.0–34.0)
MCHC: 32.9 g/dL (ref 30.0–36.0)
MCV: 90.7 fL (ref 78.0–100.0)
Platelets: 230 10*3/uL (ref 150–400)
RBC: 4.19 MIL/uL — AB (ref 4.22–5.81)
RDW: 12.7 % (ref 11.5–15.5)
WBC: 10 10*3/uL (ref 4.0–10.5)

## 2014-10-17 LAB — LIPID PANEL
CHOL/HDL RATIO: 7 ratio
CHOLESTEROL: 174 mg/dL (ref 0–200)
HDL: 25 mg/dL — AB (ref >40)
LDL CALC: 107 mg/dL — AB (ref 0–99)
TRIGLYCERIDES: 210 mg/dL — AB (ref <150)
VLDL: 42 mg/dL — AB (ref 0–40)

## 2014-10-17 MED ORDER — METOPROLOL TARTRATE 25 MG PO TABS
25.0000 mg | ORAL_TABLET | Freq: Two times a day (BID) | ORAL | Status: DC
  Administered 2014-10-17 – 2014-10-18 (×3): 25 mg via ORAL
  Filled 2014-10-17 (×3): qty 1

## 2014-10-17 MED ORDER — LISINOPRIL 5 MG PO TABS
5.0000 mg | ORAL_TABLET | Freq: Every day | ORAL | Status: DC
  Administered 2014-10-17 – 2014-10-18 (×2): 5 mg via ORAL
  Filled 2014-10-17 (×2): qty 1

## 2014-10-17 MED ORDER — LORAZEPAM 1 MG PO TABS
1.0000 mg | ORAL_TABLET | Freq: Three times a day (TID) | ORAL | Status: DC | PRN
  Administered 2014-10-17 (×2): 1 mg via ORAL
  Filled 2014-10-17 (×2): qty 1

## 2014-10-17 MED ORDER — ZOLPIDEM TARTRATE 5 MG PO TABS
5.0000 mg | ORAL_TABLET | Freq: Every evening | ORAL | Status: DC | PRN
  Administered 2014-10-17: 5 mg via ORAL
  Filled 2014-10-17: qty 1

## 2014-10-17 MED ORDER — NICOTINE 21 MG/24HR TD PT24
21.0000 mg | MEDICATED_PATCH | Freq: Every day | TRANSDERMAL | Status: DC
  Administered 2014-10-17 – 2014-10-18 (×2): 21 mg via TRANSDERMAL
  Filled 2014-10-17 (×2): qty 1

## 2014-10-17 NOTE — Care Management Note (Signed)
Case Management Note  Patient Details  Name: Damon Smith MRN: [id number] Date of Birth: [birth year]  Subjective/Objective:    Adm w mi               Action/Plan: lives w friend   Expected Discharge Date:                  Expected Discharge Plan:  Home/Self Care  In-House Referral:     Discharge planning Services  CM Consult, Medication Assistance, [facility]  Post Acute Care Choice:    Choice offered to:     DME Arranged:    DME Agency:     HH Arranged:    [place]:     Status of Service:     Medicare Important Message Given:    Date Medicare IM Given:    Medicare IM give by:    Date Additional Medicare IM Given:    Additional Medicare Important Message give by:     If discussed at [place] of Stay Meetings, dates discussed:    Additional Comments: ur review done. Pt lives in [place] , no ins. He goes to pain clinic in [place] but has not pcp. Gave him inform on [facility] and [facility]. Gave pt 30day free brilinta card. Left pt assist form on shadow chart. Pt aware after md signs he will need to send in w proof of income.  Lacretia Leigh, RN 10/17/2014, 9:20 AM

## 2014-10-17 NOTE — Progress Notes (Signed)
  Echocardiogram 2D Echocardiogram has been performed.  Donata Clay 10/17/2014, 10:26 AM

## 2014-10-17 NOTE — Progress Notes (Addendum)
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 Tamala Julian Patient Profile: 49 yo male w/ PMH of HTN and tobacco abuse with no prior cardiac history who presented to [facility] on 10/16/2014 as an acute inferior STEMI.  SUBJECTIVE: Feeling well. Denies any recurrence in his chest pain. Reports his breathing is better now than it has been in months. Reports having trouble sleeping overnight.  OBJECTIVE Filed Vitals:   10/17/14 0700 10/17/14 0800 10/17/14 0900 10/17/14 0948  BP: 96/57 106/61 120/86 120/86  Pulse: 64 65 73 82  Temp:      TempSrc:      Resp: 13 11 13    Height:      Weight:      SpO2: 96% 99% 100%     Intake/Output Summary (Last 24 hours) at 10/17/14 1121 Last data filed at 10/17/14 0800  Gross per 24 hour  Intake 1451.8 ml  Output   2075 ml  Net -623.2 ml   Filed Weights   10/16/14 1738 10/16/14 1856  Weight: 200 lb 9.9 oz (91 kg) 194 lb (87.998 kg)    PHYSICAL EXAM General: Well developed, well nourished, male in no acute distress. Head: Normocephalic, atraumatic.  Neck: Supple without bruits, JVD not elevated. Lungs:  Resp regular and unlabored, CTA without wheezing or rales. Heart: RRR, S1, S2, no S3, S4, or murmur; no rub. Abdomen: Soft, non-tender, non-distended with normoactive bowel sounds. No hepatomegaly. No rebound/guarding. No obvious abdominal masses. Extremities: No clubbing, cyanosis, or edema. Distal pedal pulses are 2+ bilaterally. Neuro: Alert and oriented X 3. Moves all extremities spontaneously. Psych: Normal  affect.  LABS: CBC:  Recent Labs  10/16/14 1928 10/17/14 0806  WBC 11.5* 10.0  HGB 13.9 12.5*  HCT 40.7 38.0*  MCV 89.6 90.7  PLT 260 643   Basic Metabolic Panel:  Recent Labs  10/17/14 0147 10/17/14 0806  NA 135 135  K 3.6 3.6  CL 104 104  CO2 21* 25  GLUCOSE 107* 107*  BUN 8 6  CREATININE 0.92 0.94  CALCIUM 8.8* 8.7*   Cardiac Enzymes:  Recent Labs  10/16/14 1928 10/17/14 0147 10/17/14 0806  TROPONINI 1.37* 6.70* 5.77*   Fasting Lipid Panel:  Recent Labs  10/17/14 0147  CHOL 174  HDL 25*  LDLCALC 107*  TRIG 210*  CHOLHDL 7.0    TELE:  NSR with rate in 60's - 70's. Occasional PVC's.      ECHO: Pending  CARDIAC CATHETERIZATION: 10/16/2014 1. 1st RPLB lesion, 70% stenosed. 2. 1st Mrg lesion, 75% stenosed. 3. Mid LAD to Dist LAD lesion, 50% stenosed. 4. Ost 1st Diag lesion, 70% stenosed. 5. 1st Diag lesion, 35% stenosed. 6. LM lesion, 45% stenosed. 7. Prox RCA to Dist RCA lesion, 99% stenosed. There is a 0% residual stenosis post intervention. 8. A drug-eluting stent was placed.   Acute inferior wall ST elevation myocardial infarction due to high-grade mid right coronary obstruction with likely distal embolization to the microcirculation.  Successful PTCA and stenting of the mid right coronary from 99% to 0% with TIMI  grade 3 flow. Residual 50-70% left ventricular branch obstruction is noted.  Moderate distal left main, moderate first diagonal stenosis, mild-to-moderate diffuse mid LAD, and moderately severe first obtuse marginal  Low normal left ventricular function with inferoapical hypokinesis. EF 45-50%.   RECOMMENDATIONS:  Aggressive risk factor modification including lipid management and smoking cessation.  Care management assistance with medication needs.  Phase I into cardiac rehabilitation  Discharge 48-72 hours.  Patient is high risk for medication noncompliance.   Current Medications:  . aspirin EC  81 mg Oral Daily   . atorvastatin  80 mg Oral q1800  . enoxaparin (LOVENOX) injection  40 mg Subcutaneous Q24H  . Influenza vac split quadrivalent PF  0.5 mL Intramuscular Tomorrow-1000  . metoprolol tartrate  25 mg Oral BID  . nicotine  21 mg Transdermal Daily  . sodium chloride  3 mL Intravenous Q12H  . ticagrelor  90 mg Oral BID      ASSESSMENT AND PLAN:  1. ST elevation myocardial infarction (STEMI) of inferior wall - presented with an acute inferior STEMI to [facility] on 10/16/2014. - Taken emergently to the cath lab. Proximal RCA to Distal RCA was 99% stenosed. DES placed to mid-RCA. Aggressive risk factor modification was recommended along with Cardiac Rehab. - cyclic troponin values have been 1.37, 6.70, and 5.77 respectively. Lipid panel included above. A1c is pending. - Echocardiogram has been performed with results pending. - continue ASA, Statin, BB, and Brilinta. Consider addition of ACE-I if BP can tolerate. - will transfer to telemetry with possible d/c in the AM.  2. Tobacco abuse - tobacco cessation encouraged - Nicotine patch currently in place.  3. Dyslipidemia - LDL elevated to 107, Triglycerides 210, HDL low at 25. - continue statin therapy  4. HTN - BP has been 96/53 - 148/91 in the past 24 hours - continue current medication regimen.   Arna Medici , PA-C 10:35 AM 9/28/2016Pager: [phone]   I have seen, examined and evaluated the patient this AM along with Ms. Ahmed Prima, PA.  After reviewing all the available data and chart,  I agree with her findings, examination as well as impression recommendations.  Young man - long-standing smoking history, admitted with Inferior STEMI with subtotal RCA occluded - PCI with DES.  Has quite a bit of residual CAD including LM disease.  Thankfully, Troponin levels are not overly high - suggesting reperfusion prior to PCI.  ASA/ Brilinta - will re-enforce need for uninterupted Rx x 1 yr.  Statin & BB added (I  increased BB dose to 25 mg bid)  He did not really have any prior CRFs diagnosed,but is a long-term smoker. -- Lipid Panel indicates Low HDL & High LDL --> high dose statin -- Nicoderm patch - counseling  -- on BB, plan restarting ACE-I @ 1/2 home dose.  Transfer to Tele today, anticipate d/c tomorrow.  With noted trouble sleeping - consider evaluation for OSA.   Leonie Man, M.D., M.S. Interventional Cardiologist   Pager # [phone]

## 2014-10-17 NOTE — Progress Notes (Signed)
CARDIAC REHAB PHASE I   PRE:  Rate/Rhythm: 82 SR  BP:  Supine: 109/66  Sitting:   Standing:    SaO2:   MODE:  Ambulation: 550 ft   POST:  Rate/Rhythm: 80 SR  BP:  Supine: 112/74  Sitting:   Standing:    SaO2: 99%RA 1320-1432 Pt walked 550 ft with steady gait. No CP/ tolerated well. MI education completed except ex and Phase2. Discussed smoking cessation and fake cigarette and handout given. Pt stated he is overwhelmed right now and does not know if he will use nicotine patches or not. Discussed heart healthy diet, MI restrictions, NTG use, and importance of brilinta with stent. Has brilinta booklet. Did not complete ed as pt has not slept but 45 minutes and he is overwhelmed. Will follow up tomorrow.   Graylon Good, RN BSN  10/17/2014 2:29 PM

## 2014-10-18 ENCOUNTER — Telehealth: Payer: Self-pay | Admitting: Physician Assistant

## 2014-10-18 DIAGNOSIS — G8929 Other chronic pain: Secondary | ICD-10-CM

## 2014-10-18 LAB — HEMOGLOBIN A1C
Hgb A1c MFr Bld: 5.5 % (ref 4.8–5.6)
Mean Plasma Glucose: 111 mg/dL

## 2014-10-18 LAB — TROPONIN I
TROPONIN I: 3.42 ng/mL — AB (ref <0.031)
Troponin I: 4.31 ng/mL (ref <0.031)

## 2014-10-18 MED ORDER — NITROGLYCERIN 0.4 MG SL SUBL: 0.4000 mg | SUBLINGUAL_TABLET | SUBLINGUAL | Status: AC | PRN

## 2014-10-18 MED ORDER — NICOTINE 21 MG/24HR TD PT24: 21.0000 mg | MEDICATED_PATCH | Freq: Every day | TRANSDERMAL | Status: DC

## 2014-10-18 MED ORDER — ATORVASTATIN CALCIUM 80 MG PO TABS: 80.0000 mg | ORAL_TABLET | Freq: Every day | ORAL | Status: DC

## 2014-10-18 MED ORDER — HYDROCODONE-ACETAMINOPHEN 10-325 MG PO TABS: 1.0000 | ORAL_TABLET | Freq: Four times a day (QID) | ORAL | Status: DC | PRN

## 2014-10-18 MED ORDER — METOPROLOL TARTRATE 25 MG PO TABS: 25.0000 mg | ORAL_TABLET | Freq: Two times a day (BID) | ORAL | Status: DC

## 2014-10-18 MED ORDER — ASPIRIN 81 MG PO TBEC: 81.0000 mg | DELAYED_RELEASE_TABLET | Freq: Every day | ORAL | Status: DC

## 2014-10-18 MED ORDER — LISINOPRIL 5 MG PO TABS: 5.0000 mg | ORAL_TABLET | Freq: Every day | ORAL | Status: DC

## 2014-10-18 MED ORDER — TICAGRELOR 90 MG PO TABS: 90.0000 mg | ORAL_TABLET | Freq: Two times a day (BID) | ORAL | Status: DC

## 2014-10-18 NOTE — Care Management (Addendum)
This Case Manager received phone call from Jacqlyn Krauss, RN CM that patient hospitalized for STEMI-being discharged on brilinta. Patient will be given card for 30 day free trial of Brilinta per Jacqlyn Krauss, RN CM. Patient uninsured and does not have a PCP. Hospital follow-up appointment obtained on 10/23/14 at 1115 with Dr. Jarold Song.  Appointment placed on AVS.   Patient should present trial card for Brilinta to [facility] after discharge and speak with Pharmacy staff for medication assistance paperwork. Jacqlyn Krauss, RN CM updated.

## 2014-10-18 NOTE — Discharge Summary (Signed)
Discharge Summary   Patient ID: Damon Smith,  MRN: [id number], DOB/AGE: 1965-05-01 49 y.o.  Admit date: 10/16/2014 Discharge date: 10/18/2014  Primary Care Provider: Dr. Jarold Song (newly set up by case manager) Primary Cardiologist: Dr. 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  ------------------------------------------------------------------- History:  PMH: Chronic pain. Risk factors: Current tobacco use. Dyslipidemia.  ------------------------------------------------------------------- Study Conclusions  - Left ventricle: The cavity size was normal. Systolic function was normal. The estimated ejection fraction was in the range of 55% to 60%. Hypokinesis of the basal-midinferior myocardium. Left ventricular diastolic function parameters were normal. - Aortic valve: There was trivial regurgitation. - Mitral valve: There was mild regurgitation. - Pulmonary arteries: PA peak pressure: 33 mm Hg (S).     Cardiac catheterization 10/16/2014  Conclusion    1. 1st RPLB lesion, 70% stenosed. 2. 1st Mrg lesion, 75% stenosed. 3. Mid LAD to Dist LAD lesion, 50% stenosed. 4. Ost 1st Diag lesion, 70% stenosed. 5. 1st Diag lesion, 35% stenosed. 6. LM lesion, 45% stenosed. 7. Prox RCA to Dist RCA lesion, 99% stenosed. There is a 0% residual stenosis post intervention. 8. A drug-eluting stent was placed.   Acute  inferior wall ST elevation myocardial infarction due to high-grade mid right coronary obstruction with likely distal embolization to the microcirculation.  Successful PTCA and stenting of the mid right coronary from 99% to 0% with TIMI grade 3 flow. Residual 50-70% left ventricular branch obstruction is noted.  Moderate distal left main, moderate first diagonal stenosis, mild-to-moderate diffuse mid LAD, and moderately severe first obtuse marginal  Low normal left ventricular function with inferoapical hypokinesis. EF 45-50%.   RECOMMENDATIONS:   Aggressive risk factor modification including lipid management and smoking cessation.  Care management assistance with medication needs.  Phase I into cardiac rehabilitation  Discharge 48-72 hours.  Patient is high risk for medication noncompliance.      Hospital Course  The patient is a 49 year old Caucasian male with past medical history of hypertension and chronic knee pain but no prior cardiac history presented on 10/16/2014 as inferior STEMI. Per patient, he has been smoking 2 packs a day for many years. He quit drinking 3 years ago. His father had first MI in his 47s. He has been having intermittent chest discomfort for the past 5-6 days which he felt was indigestion. However chest discomfort increasing frequency today prior to arrival, he was unable to sleep because he had 7-8 episodes of chest discomfort throughout the entire night. While he was lifting heavy object on the day of arrival, he had a significant amount of chest pain and nausea. He called EMS, who noted ST elevation in inferior lead with corresponding ST depression in the anterior lead. He was taken straight to [facility] for emergent cardiac catheterization.  Cardiac catheterization showed 50% mid to distal LAD lesion, 70% ostial D1 lesion, 70% 1st RPLB lesion, 70% 1st marginal, 45% LM, 95% proximal to distal RCA treated with DES (28  mm x 4.0 mm Synergy), EF  45-50%. Echocardiogram was obtained on the following day which showed EF 55-60%, hypokinesis of basal mid inferior myocardium, mild MR, PA peak pressure 33. Overnight, serial troponin trended up to 6.7 before trending down. Lipid profile shows cholesterol 174, trig 210, HDL 25, LDL 107. He was placed on high-dose statin. Hemoglobin A1c 5.5.  He was seen in the morning of 10/18/2014 at which time he was doing well without significant chest discomfort. He ambulated with cardiac rehabilitation without problem. According to the patient, he has chronic pain in the knee and takes Norco 10 mg 4 times a day, he is in the process of finding primary care physician, after discussing with Dr. Ellyn Hack, we have agreed to give him a one-time prescription of 60 tablets of Norco which will last him around 2 weeks. Outpatient PCP has been arranged by case manager. If he needed further more pain control later, he will need to follow-up with his PCP. He understand that this is the only time cardiology service will prescribe pain medication for him as outpatient. He is deemed stable for discharge from cardiology perspective. I have arranged a seven-day transition of care follow-up. Emphasis has been placed on compliance of DAPT and tobacco cessation. I have filled out Brilinta assistance form for him.  Discharge Vitals Blood pressure 126/82, pulse 74, temperature 97.4 F (36.3 C), temperature source Oral, resp. rate 18, height 5\' 10"  (1.778 m), weight 192 lb 14.4 oz (87.499 kg), SpO2 100 %.  Filed Weights   10/16/14 1738 10/16/14 1856 10/18/14 0429  Weight: 200 lb 9.9 oz (91 kg) 194 lb (87.998 kg) 192 lb 14.4 oz (87.499 kg)    Labs  CBC  Recent Labs  10/16/14 1928 10/17/14 0806  WBC 11.5* 10.0  HGB 13.9 12.5*  HCT 40.7 38.0*  MCV 89.6 90.7  PLT 260 811   Basic Metabolic Panel  Recent Labs  10/17/14 0147 10/17/14 0806  NA 135 135  K 3.6 3.6  CL 104 104  CO2 21* 25  GLUCOSE 107* 107*  BUN 8 6    CREATININE 0.92 0.94  CALCIUM 8.8* 8.7*   Cardiac Enzymes  Recent Labs  10/17/14 2058 10/18/14 0110 10/18/14 0707  TROPONINI 3.69* 4.31* 3.42*   Hemoglobin A1C  Recent Labs  10/17/14 0147  HGBA1C 5.5   Fasting Lipid Panel  Recent Labs  10/17/14 0147  CHOL 174  HDL 25*  LDLCALC 107*  TRIG 210*  CHOLHDL 7.0    Disposition  Pt is being discharged home today in good condition.  Follow-up Plans & Appointments      Follow-up Information    Follow up with Damon Smith     On 10/23/2014.   Why:  Hospital follow-up appointment on 10/23/14 at 11:15 am with Dr. Jarold Song. Patient should take Brilinta coupon to [facility] after discharge and ask for [facility] to get medication assistance paperwork.   Contact information:   [address] [place] [address] [phone]      Follow up with Damon Dopp, PA-C On 10/25/2014.   Specialties:  Physician Assistant, Radiology, Interventional Cardiology   Why:  12:10pm.   Contact information:   [address] [address] [phone]       Discharge Medications    Medication List    TAKE these medications        aspirin 81 MG EC tablet  Take 1 tablet (  81 mg total) by mouth daily.     atorvastatin 80 MG tablet  Commonly known as:  LIPITOR  Take 1 tablet (80 mg total) by mouth daily at 6 PM.     gabapentin 300 MG capsule  Commonly known as:  NEURONTIN  Take 300 mg by mouth 3 (three) times daily.     HYDROcodone-acetaminophen 10-325 MG tablet  Commonly known as:  NORCO  Take 1 tablet by mouth every 6 (six) hours as needed. pain     lisinopril 5 MG tablet  Commonly known as:  PRINIVIL,ZESTRIL  Take 1 tablet (5 mg total) by mouth daily.     metoprolol tartrate 25 MG tablet  Commonly known as:  LOPRESSOR  Take 1 tablet (25 mg total) by mouth 2 (two) times daily.     nicotine 21 mg/24hr patch  Commonly known as:   NICODERM CQ - dosed in mg/24 hours  Place 1 patch (21 mg total) onto the skin daily.     nitroGLYCERIN 0.4 MG SL tablet  Commonly known as:  NITROSTAT  Place 1 tablet (0.4 mg total) under the tongue every 5 (five) minutes x 3 doses as needed for chest pain.     prazosin 1 MG capsule  Commonly known as:  MINIPRESS  Take 15 mg by mouth daily.     ticagrelor 90 MG Tabs tablet  Commonly known as:  BRILINTA  Take 1 tablet (90 mg total) by mouth 2 (two) times daily.     tiZANidine 4 MG tablet  Commonly known as:  ZANAFLEX  Take 4 mg by mouth every 8 (eight) hours as needed for muscle spasms.         Duration of Discharge Encounter   Greater than 30 minutes including physician time.  Hilbert Corrigan PA-C Pager: [phone] 10/18/2014, 12:47 PM   ASSESSMENT AND PLAN: Young man - long-standing smoking history, admitted with Inferior STEMI with subtotal RCA occluded - PCI with DES. Has quite a bit of residual CAD including LM disease. Thankfully, Troponin levels are not overly high - suggesting reperfusion prior to PCI.  1. ST elevation myocardial infarction (STEMI) of inferior wall - 10/16/2014. - Proximal RCA to Distal RCA was 99% stenosed. DES placed to mid-RCA -- but mod-severe residual disease noted. Aggressive risk factor modification was recommended along with Cardiac Rehab. - Echocardiogram with normal EF & mild inferior HK c/w Inferior MI. - continue ASA, Statin, BB, and Brilinta. - Tolerating ACE-I  2. Tobacco abuse - tobacco cessation encouraged - Nicotine patch currently in place. -- encourage continued cessation & use of patch as OP.  3. Dyslipidemia - LDL elevated to 107, Triglycerides 210, HDL low at 25. - continue high dose statin therapy  4. HTN - better controlled today - started low dose BB on admission & low dose ACE-I (1/2 home dose) started yesterday.  With noted trouble sleeping - consider evaluation for OSA as OP  Chronic Pain -- will  likely need bridging doses of his Pain Meds until he can get established with a PCP (he noted Case Manager was working on assisting with [place] PCP)   Ambulate in [place] today with [place] - if stable, OK for d/c   Leonie Man, M.D., M.S. Interventional Cardiologist   Pager # [phone]

## 2014-10-18 NOTE — Progress Notes (Signed)
CARDIAC REHAB PHASE I   PRE:  Rate/Rhythm: 95 SR  BP:  Supine:   Sitting: 91/68  Standing:    SaO2:   MODE:  Ambulation: 750 ft   POST:  Rate/Rhythm: 101  BP:  Supine:   Sitting: 127/82  Standing:    SaO2: 99%RA 1002-1037 Pt walked 700 ft with steady gait. No CP. Hip pain with walk. Ex ed completed and discussed CRP 2. Gave pt financial form for program and will refer to [facility] [facility]. He is really interested in attending. Asked case manager to see as pt not sure how he is going to get meds after first month of brilinta.   Graylon Good, RN BSN  10/18/2014 10:34 AM

## 2014-10-18 NOTE — Discharge Instructions (Signed)
No driving for 24 hours. No lifting over 5 lbs for 1 week. No sexual activity for 1 week. Keep procedure site clean & dry. If you notice increased pain, swelling, bleeding or pus, call/return!  You may shower, but no soaking baths/hot tubs/pools for 1 week.  ° ° °

## 2014-10-18 NOTE — Care Management Note (Signed)
Case Management Note  Patient Details  Name: Damon Smith MRN: [id number] Date of Birth: [birth year]  Subjective/Objective:   Pt admitted for [facility]. Pt is without insurance at this time and no PCP. He goes to [facility] in [place].                  Action/Plan: Pt has Brilinta card. CM did make him aware to call the Support Service Number on the booklet. Pt states he wants his care consolidated. CM did call Carmela Hurt Liaison with [facility] at the [facility] and [facility]. Hospital f/u established. Pt will be able to use the [facility] for medications as well. Dianna will help the pt fill out patient assistance forms for Brilinta. No further needs from CM at this time.    Expected Discharge Date:                  Expected Discharge Plan:  Home/Self Care  In-House Referral:     Discharge planning Services  CM Consult, Medication Assistance, [facility]  Post Acute Care Choice:    Choice offered to:     DME Arranged:    DME Agency:     HH Arranged:    HH Agency:     Status of Service:     Medicare Important Message Given:    Date Medicare IM Given:    Medicare IM give by:    Date Additional Medicare IM Given:    Additional Medicare Important Message give by:     If discussed at [place] of Stay Meetings, dates discussed:    Additional Comments:  Bethena Roys, RN 10/18/2014, 11:05 AM

## 2014-10-18 NOTE — Telephone Encounter (Signed)
TCM   Appt with Nicki Reaper weaver 10/06 @ 1210  TCM per [facility]O

## 2014-10-18 NOTE — Progress Notes (Signed)
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 Tamala Julian Patient Profile: 49 yo male w/ PMH of HTN and tobacco abuse with no prior cardiac history who presented to [facility] on 10/16/2014 as an acute inferior STEMI.  SUBJECTIVE: Feeling well. Denies any recurrence in his chest pain. Reports his breathing is better now than it has been in months.   Did well with [place] yesterday - mostly bothered by his chronic MSK (Hip & Back pain)  OBJECTIVE Filed Vitals:   10/17/14 2233 10/18/14 0427 10/18/14 0429 10/18/14 0522  BP: 129/85 122/78  119/86  Pulse: 83 74    Temp:  97.5 F (36.4 C)  98.2 F (36.8 C)  TempSrc:  Oral  Oral  Resp:  12  12  Height:      Weight:   192 lb 14.4 oz (87.499 kg)   SpO2:  98%  98%    Intake/Output Summary (Last 24 hours) at 10/18/14 2025 Last data filed at 10/18/14 0801  Gross per 24 hour  Intake    960 ml  Output   1690 ml  Net   -730 ml   Filed Weights   10/16/14 1738 10/16/14 1856 10/18/14 0429  Weight: 200 lb 9.9 oz (91 kg) 194 lb (87.998 kg) 192 lb 14.4 oz (87.499 kg)    PHYSICAL EXAM General: Well developed, well nourished, male in no acute distress. Head: Normocephalic, atraumatic.  Neck: Supple without bruits, JVD not elevated. Lungs:  Resp regular and unlabored, CTA without wheezing or rales. Heart: RRR, S1, S2, no S3, S4, or murmur; no rub. Abdomen: Soft, non-tender, non-distended with normoactive bowel sounds. No hepatomegaly. No rebound/guarding. No obvious abdominal masses. Extremities: No clubbing, cyanosis, or edema. Distal  pedal pulses are 2+ bilaterally. Neuro: Alert and oriented X 3. Moves all extremities spontaneously. Psych: Normal affect.  LABS: CBC:  Recent Labs  10/16/14 1928 10/17/14 0806  WBC 11.5* 10.0  HGB 13.9 12.5*  HCT 40.7 38.0*  MCV 89.6 90.7  PLT 260 427   Basic Metabolic Panel:  Recent Labs  10/17/14 0147 10/17/14 0806  NA 135 135  K 3.6 3.6  CL 104 104  CO2 21* 25  GLUCOSE 107* 107*  BUN 8 6  CREATININE 0.92 0.94  CALCIUM 8.8* 8.7*   Cardiac Enzymes:  Recent Labs  10/17/14 2058 10/18/14 0110 10/18/14 0707  TROPONINI 3.69* 4.31* 3.42*   Fasting Lipid Panel:  Recent Labs  10/17/14 0147  CHOL 174  HDL 25*  LDLCALC 107*  TRIG 210*  CHOLHDL 7.0    TELE:  NSR with rate in 60's - 70's. Occasional PVC's.      ECHO: Pending  CARDIAC CATHETERIZATION: 10/16/2014 1. 1st RPLB lesion, 70% stenosed. 2. 1st Mrg lesion, 75% stenosed. 3. Mid LAD to Dist LAD lesion, 50% stenosed. 4. Ost 1st Diag lesion, 70% stenosed. 5. 1st Diag lesion, 35% stenosed. 6. LM lesion, 45% stenosed. 7. Prox RCA to Dist RCA lesion, 99% stenosed. There is a 0% residual stenosis post intervention. 8. A drug-eluting stent was placed.  Acute inferior wall ST elevation myocardial infarction due to high-grade mid right coronary obstruction with likely distal embolization to the microcirculation.  Successful PTCA and stenting of the mid right coronary from 99% to 0% with TIMI grade 3 flow. Residual 50-70% left ventricular branch obstruction is noted.  Moderate distal left main, moderate first diagonal stenosis, mild-to-moderate diffuse mid LAD, and moderately severe first obtuse marginal  Low normal left ventricular function with inferoapical hypokinesis. EF 45-50%.   RECOMMENDATIONS:  Aggressive risk factor modification including lipid management and smoking cessation.  Care management assistance with medication needs.  Phase I into cardiac rehabilitation  Discharge 48-72  hours.  Patient is high risk for medication noncompliance.   Echo 9/28:  - Left ventricle: cavity size normal. Systolic function normal. EF ~ 55-60%.   Hypokinesis of the basal-midinferior myocardium.   Left ventricular diastolic function parameters were normal. - Aortic valve: There was trivial regurgitation. - Mitral valve: There was mild regurgitation. - Pulmonary arteries: PA peak pressure: 33 mm Hg (S).  Current Medications:  . aspirin EC  81 mg Oral Daily  . atorvastatin  80 mg Oral q1800  . enoxaparin (LOVENOX) injection  40 mg Subcutaneous Q24H  . Influenza vac split quadrivalent PF  0.5 mL Intramuscular Tomorrow-1000  . lisinopril  5 mg Oral Daily  . metoprolol tartrate  25 mg Oral BID  . nicotine  21 mg Transdermal Daily  . sodium chloride  3 mL Intravenous Q12H  . ticagrelor  90 mg Oral BID      ASSESSMENT AND PLAN: Young man - long-standing smoking history, admitted with Inferior STEMI with subtotal RCA occluded - PCI with DES.  Has quite a bit of residual CAD including LM disease.  Thankfully, Troponin levels are not overly high - suggesting reperfusion prior to PCI.  1. ST elevation myocardial infarction (STEMI) of inferior wall - 10/16/2014. - Proximal RCA to Distal RCA was 99% stenosed. DES placed to mid-RCA -- but mod-severe residual disease noted.  Aggressive risk factor modification was recommended along with Cardiac Rehab. - Echocardiogram with normal EF & mild inferior HK c/w Inferior MI. - continue ASA, Statin, BB, and Brilinta. - Tolerating ACE-I  2. Tobacco abuse - tobacco cessation encouraged - Nicotine patch currently in place. -- encourage continued cessation & use of patch as OP.  3. Dyslipidemia - LDL elevated to 107, Triglycerides 210, HDL low at 25. - continue high dose statin therapy  4. HTN - better controlled today - started low dose BB on admission & low dose ACE-I (1/2 home dose) started yesterday.  With noted trouble sleeping -  consider evaluation for OSA as OP  Chronic Pain -- will likely need bridging doses of his Pain Meds until he can get established with a PCP (he noted Case Manager was working on assisting with [place] PCP)   Ambulate in [place] today with [place] - if stable, OK for d/c     Leonie Man, M.D., M.S. Interventional Cardiologist   Pager # [phone]

## 2014-10-19 ENCOUNTER — Other Ambulatory Visit: Payer: Self-pay | Admitting: Physician Assistant

## 2014-10-19 ENCOUNTER — Telehealth: Payer: Self-pay | Admitting: Physician Assistant

## 2014-10-19 NOTE — Telephone Encounter (Signed)
Patient contacted by Almyra Deforest PA and minipress has been discontinued.

## 2014-10-19 NOTE — Telephone Encounter (Signed)
Contacted by patient regarding minipress, I have called the patient, he states he does not have BPH and has not used minipress for years. I have discontinued this medication from his medication list.   Hilbert Corrigan PA Pager: [phone]

## 2014-10-19 NOTE — Telephone Encounter (Signed)
Patient contacted regarding discharge from [facility] on October 18, 2014.  Patient understands to follow up with Kaelen Caughlin Richardson Dopp on 10/25/14 @12 :10 pm at the [facility].  Patient understands his discharge instructions.  Patient understands his medications and regimen.  Patient understands to bring all of his medications to this visit.   Patient informed nurse that he does not have his minipress prescription at home. Nurse informed patient that the dose is 15 mg daily and that it had to be confirmed with his [employer],then would be sent to his pharmacy.

## 2014-10-23 ENCOUNTER — Encounter: Payer: Self-pay | Admitting: Family Medicine

## 2014-10-23 ENCOUNTER — Telehealth: Payer: Self-pay | Admitting: Clinical

## 2014-10-23 ENCOUNTER — Ambulatory Visit: Payer: Self-pay | Attending: Family Medicine | Admitting: Family Medicine

## 2014-10-23 VITALS — BP 127/79 | HR 86 | Temp 98.3°F | Resp 18 | Ht 70.0 in | Wt 197.0 lb

## 2014-10-23 DIAGNOSIS — F418 Other specified anxiety disorders: Secondary | ICD-10-CM | POA: Insufficient documentation

## 2014-10-23 DIAGNOSIS — I25110 Atherosclerotic heart disease of native coronary artery with unstable angina pectoris: Secondary | ICD-10-CM | POA: Insufficient documentation

## 2014-10-23 DIAGNOSIS — G8929 Other chronic pain: Secondary | ICD-10-CM | POA: Insufficient documentation

## 2014-10-23 DIAGNOSIS — I10 Essential (primary) hypertension: Secondary | ICD-10-CM | POA: Insufficient documentation

## 2014-10-23 DIAGNOSIS — F329 Major depressive disorder, single episode, unspecified: Secondary | ICD-10-CM

## 2014-10-23 DIAGNOSIS — F419 Anxiety disorder, unspecified: Secondary | ICD-10-CM

## 2014-10-23 DIAGNOSIS — Z720 Tobacco use: Secondary | ICD-10-CM | POA: Insufficient documentation

## 2014-10-23 MED ORDER — TICAGRELOR 90 MG PO TABS: 90.0000 mg | ORAL_TABLET | Freq: Two times a day (BID) | ORAL | Status: DC

## 2014-10-23 NOTE — Progress Notes (Signed)
Pt's here for hospital f/up for stent placement. Pt reports no pain today. Pt states feeling anxious also. Pt states that he has intermittent intervals of nerve twitch in R arm and L leg.

## 2014-10-23 NOTE — Telephone Encounter (Signed)
Attempt to f/u with pt, no message left as "phone is not accepting messages at this time".

## 2014-10-23 NOTE — Patient Instructions (Signed)
Nicotine Addiction Nicotine can act as both a stimulant (excites/activates) and a sedative (calms/quiets). Immediately after exposure to nicotine, there is a "kick" caused in part by the drug's stimulation of the adrenal glands and resulting discharge of adrenaline (epinephrine). The rush of adrenaline stimulates the body and causes a sudden release of sugar. This means that smokers are always slightly hyperglycemic. Hyperglycemic means that the blood sugar is high, just like in diabetics. Nicotine also decreases the amount of insulin which helps control sugar levels in the body. There is an increase in blood pressure, breathing, and the rate of heart beats.  In addition, nicotine indirectly causes a release of dopamine in the brain that controls pleasure and motivation. A similar reaction is seen with other drugs of abuse, such as cocaine and heroin. This dopamine release is thought to cause the pleasurable sensations when smoking. In some different cases, nicotine can also create a calming effect, depending on sensitivity of the smoker's nervous system and the dose of nicotine taken. WHAT HAPPENS WHEN NICOTINE IS TAKEN FOR LONG PERIODS OF TIME?  Long-term use of nicotine results in addiction. It is difficult to stop.  Repeated use of nicotine creates tolerance. Higher doses of nicotine are needed to get the "kick." When nicotine use is stopped, withdrawal may last a month or more. Withdrawal may begin within a few hours after the last cigarette. Symptoms peak within the first few days and may lessen within a few weeks. For some people, however, symptoms may last for months or longer. Withdrawal symptoms include:   Irritability.  Craving.  Learning and attention deficits.  Sleep disturbances.  Increased appetite. Craving for tobacco may last for 6 months or longer. Many behaviors done while using nicotine can also play a part in the severity of withdrawal symptoms. For some people, the feel,  smell, and sight of a cigarette and the ritual of obtaining, handling, lighting, and smoking the cigarette are closely linked with the pleasure of smoking. When stopped, they also miss the related behaviors which make the withdrawal or craving worse. While nicotine gum and patches may lessen the drug aspects of withdrawal, cravings often persist. WHAT ARE THE MEDICAL CONSEQUENCES OF NICOTINE USE?  Nicotine addiction accounts for one-third of all cancers. The top cancer caused by tobacco is lung cancer. Lung cancer is the number one cancer killer of both men and women.  Smoking is also associated with cancers of the:  Mouth.  Pharynx.  Larynx.  Esophagus.  Stomach.  Pancreas.  Cervix.  Kidney.  Ureter.  Bladder.  Smoking also causes lung diseases such as lasting (chronic) bronchitis and emphysema.  It worsens asthma in adults and children.  Smoking increases the risk of heart disease, including:  Stroke.  Heart attack.  Vascular disease.  Aneurysm.  Passive or secondary smoke can also increase medical risks including:  Asthma in children.  Sudden Infant Death Syndrome (SIDS).  Additionally, dropped cigarettes are the leading cause of residential fire fatalities.  Nicotine poisoning has been reported from accidental ingestion of tobacco products by children and pets. Death usually results in a few minutes from respiratory failure (when a person stops breathing) caused by paralysis. TREATMENT   Medication. Nicotine replacement medicines such as nicotine gum and the patch are used to stop smoking. These medicines gradually lower the dosage of nicotine in the body. These medicines do not contain the carbon monoxide and other toxins found in tobacco smoke.  Hypnotherapy.  Relaxation therapy.  Nicotine Anonymous (a 12-step support   program). Find times and locations in your local yellow pages. Document Released: 09/11/2003 Document Revised: 03/30/2011 Document  Reviewed: 03/03/2013 ExitCare Patient Information 2015 [employer]. This information is not intended to replace advice given to you by your health care provider. Make sure you discuss any questions you have with your health care provider.  

## 2014-10-23 NOTE — Progress Notes (Signed)
CC: Follow-up from hospitalization.  HPI: Damon Smith is a 49 y.o. male with a history of tobacco abuse, hypertension, chronic joint pains who was hospitalized from 10/16/14-10/18/14 after he had presented to [facility] ED with chest pains and was noted to have ST elevation by EMS.  On arrival to the hospital, EKG showed ST elevation in the inferior lead with corresponding ST depression in the septal lead. Troponin was 1.37 .He was taken to the cath lab on 10/16/14 and cardiac cath revealed 1st RPLB lesion, 70% stenosis, 1st Marginal lesion 75% stenosis, Mid LAD to Distal LAD lesion 50% stenosis, Ost 1st Diagonal lesion 70% stenosis, 1st Diagonal lesion 35% stenosis, LM lesion 45% stenosis, prox RCA lesion 99% and a DES was placed with a 0%residual stenosis post intervention, LVEF45-50% with inferoapical hypokinesis. He was placed on dual antiplatelet therapy with Brilinta and aspirin as well as atorvastatin and metoprolol; post cath period was uneventful and he was subsequently discharged to follow-up with cardiology.   Interval history: He denies chest pains or shortness of breath at this time and has been compliant with his medications. He does have chronic neck, hip, bilateral knee pain for which he receives Norco from his PCP in [place] -Dr Gwyndolyn Saxon. Patient has No headache, No chest pain, No abdominal pain - No Nausea, No new weakness tingling or numbness, No Cough - SOB.  No Known Allergies Past Medical History  Diagnosis Date  . Hyperlipidemia with target LDL less than 70 10/16/2014  . Essential hypertension 10/17/2014  . ST elevation myocardial infarction (STEMI) of inferior wall, initial episode of care ([place]) 10/16/2014  . 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 Presence of DES in RCA : Synergy 4.0 mm x 28 mm (4.5 mm) 10/17/2014  . Tobacco abuse  10/16/2014  . Chronic pain 10/16/2014    On Narcotics @ home.   Current Outpatient Prescriptions on File Prior to Visit  Medication Sig Dispense Refill  . aspirin EC 81 MG EC tablet Take 1 tablet (81 mg total) by mouth daily.    Marland Kitchen gabapentin (NEURONTIN) 300 MG capsule Take 300 mg by mouth 3 (three) times daily.    Marland Kitchen HYDROcodone-acetaminophen (NORCO) 10-325 MG tablet Take 1 tablet by mouth every 6 (six) hours as needed. pain 60 tablet 0  . lisinopril (PRINIVIL,ZESTRIL) 5 MG tablet Take 1 tablet (5 mg total) by mouth daily. 90 tablet 3  . metoprolol tartrate (LOPRESSOR) 25 MG tablet Take 1 tablet (25 mg total) by mouth 2 (two) times daily. 60 tablet 11  . nitroGLYCERIN (NITROSTAT) 0.4 MG SL tablet Place 1 tablet (0.4 mg total) under the tongue every 5 (five) minutes x 3 doses as needed for chest pain. 25 tablet 3  . tiZANidine (ZANAFLEX) 4 MG tablet Take 4 mg by mouth every 8 (eight) hours as needed for muscle spasms.     Marland Kitchen atorvastatin (LIPITOR) 80 MG tablet Take 1 tablet (80 mg total) by mouth daily at 6 PM. 90 tablet 3  . nicotine (NICODERM CQ - DOSED IN MG/24 HOURS) 21 mg/24hr patch Place 1 patch (21 mg total) onto the skin daily. (Patient not taking: Reported on 10/23/2014) 28 patch 0   No current facility-administered medications on file prior to visit.   Family History  Problem Relation Age of Onset  . Heart attack Father 1  Social History   Social History  . Marital Status: Single    Spouse Name: N/A  . Number of Children: N/A  . Years of Education: N/A   Occupational History  . Not on file.   Social History Main Topics  . Smoking status: Current Every Day Smoker -- 2.00 packs/day  . Smokeless tobacco: Not on file     Comment: 2 ppd  . Alcohol Use: No  . Drug Use: No  . Sexual Activity: Not on file   Other Topics Concern  . Not on file   Social History Narrative    Review of Systems: Constitutional: Negative for fever, chills, diaphoresis, activity change,  appetite change and fatigue. HENT: Negative for ear pain, nosebleeds, congestion, facial swelling, rhinorrhea, neck pain, neck stiffness and ear discharge.  Eyes: Negative for pain, discharge, redness, itching and visual disturbance. Respiratory: Negative for cough, choking, chest tightness, shortness of breath, wheezing and stridor.  Cardiovascular: Negative for chest pain, palpitations and leg swelling. Gastrointestinal: Negative for abdominal distention. Genitourinary: Negative for dysuria, urgency, frequency, hematuria, flank pain, decreased urine volume, difficulty urinating and dyspareunia.  Musculoskeletal: see hpi Neurological: Negative for dizziness, tremors, seizures, syncope, facial asymmetry, speech difficulty, weakness, light-headedness, numbness and headaches.  Hematological: Negative for adenopathy. Does not bruise/bleed easily. Psychiatric/Behavioral: Negative for hallucinations, behavioral problems, confusion, dysphoric mood, decreased concentration and agitation.    Objective: Filed Vitals:   10/23/14 1112 10/23/14 1114  BP:  127/79  Pulse:  86  Temp:  98.3 F (36.8 C)  TempSrc:  Oral  Resp:  18  Height: 5\' 10"  (1.778 m)   Weight: 197 lb (89.359 kg)   SpO2:  98%      Filed Vitals:   10/23/14 1114  BP: 127/79  Pulse: 86  Temp: 98.3 F (36.8 C)  Resp: 18    Physical Exam: Constitutional: Patient appears well-developed and well-nourished. No distress. HENT: Normocephalic, atraumatic, External right and left ear normal. Oropharynx is clear and moist.  Eyes: Conjunctivae and EOM are normal. PERRLA, no scleral icterus. Neck: Normal ROM. Neck supple. No JVD. No tracheal deviation. No thyromegaly. CVS: RRR, S1/S2 +, no murmurs, no gallops, no carotid bruit.  Pulmonary: Effort and breath sounds normal, no stridor, rhonchi, wheezes, rales.  Abdominal: Soft. BS +,  no distension, tenderness, rebound or guarding.  Musculoskeletal: Crepitus on range of motion of both  knees with tenderness on range of motion of left knee.  Lymphadenopathy: No lymphadenopathy noted, cervical, inguinal or axillary Neuro: Alert. Normal reflexes, muscle tone coordination. No cranial nerve deficit. Skin: Skin is warm and dry. No rash noted. Not diaphoretic. No erythema. No pallor. Psychiatric: Normal mood and affect. Behavior, judgment, thought content normal.  Lab Results  Component Value Date   WBC 10.0 10/17/2014   HGB 12.5* 10/17/2014   HCT 38.0* 10/17/2014   MCV 90.7 10/17/2014   PLT 230 10/17/2014   Lab Results  Component Value Date   CREATININE 0.94 10/17/2014   BUN 6 10/17/2014   NA 135 10/17/2014   K 3.6 10/17/2014   CL 104 10/17/2014   CO2 25 10/17/2014    Lab Results  Component Value Date   HGBA1C 5.5 10/17/2014   Lipid Panel     Component Value Date/Time   CHOL 174 10/17/2014 0147   TRIG 210* 10/17/2014 0147   HDL 25* 10/17/2014 0147   CHOLHDL 7.0 10/17/2014 0147   VLDL 42* 10/17/2014 0147   LDLCALC 107* 10/17/2014 0147  Assessment and plan:   Coronary artery disease:  Status post DES to RCA Continue dual antiplatelet therapy for one year. Prescription written for Brilinta and he has been advised to stop at the pharmacy to complete the medication assistance paperwork. Continue aggressive lifestyle modification. Scheduled to see cardiology in 2 days.  Chronic pain He receives Norco from his PCP-Dr. Gwyndolyn Saxon for chronic pain in his neck, hips and knees and will remain with Dr. Gwyndolyn Saxon in [place] as I have informed him that of the pain management policy of the clinic  Anxiety and depression: He is reluctant to commencing SSRI. I have offered to call in the LCSW to have some psychotherapy with him but on returning to the room to patient had left prior to being seen.  Hypertension: Controlled. Continue antihypertensives as well as low-sodium, DASH diet.  Tobacco abuse: Smoking cessation support: smoking cessation hotline:  1-800-QUIT-NOW.  Smoking cessation classes are available through [facility]. Call [phone] or visit our website at [email or web address]  Spent 3 minutes counseling on smoking cessation and patient is not ready to quit.  Arnoldo Morale, [place] [facility] [phone] 10/23/2014, 12:20 PM

## 2014-10-24 NOTE — Progress Notes (Signed)
Cardiology Office Note   Date:  10/25/2014   ID:  Damon Smith, DOB 1965-10-12, MRN [id number]  PCP:  No primary care provider on file.  Cardiologist:  Dr. Daneen Schick   Electrophysiologist:  n/a  Chief Complaint  Patient presents with  . Hospitalization Follow-up    Status post inferior STEMI  . Coronary Artery Disease     History of Present Illness: Damon Smith is a 49 y.o. male with a hx of HTN, tobacco abuse.  Admitted 9/27-9/29 with inferior STEMI.  LHC demonstrated thrombotic proximal RCA 99% lesion that was treated with Synergy DES.  He had mod residual disease in the LM, LAD and LCx.  This was to be treated medically.  Echo demonstrated normal LVF and inf HK.  Post MI course was fairly uneventful.  Returns for FU.  He is feeling better.  Denies further angina. He is anxious. He is having a lot of emotional issues dealing with his new Dx of CAD.  He has established with PCP. He denies significant dyspnea. Denies orthopnea, PND, edema. No syncope.  He is adherent with his medications.     Studies/Reports Reviewed Today:  Echo 10/17/14 EF 50-55%, inferior HK, normal diastolic function, trivial AI, mild MR, PASP 33 mmHg  LHC 10/16/14 LM: 45% LAD: Mid to distal 50%, ostial D1 70% and 35% LCx: OM1 75% RCA: Proximal 99% thrombotic >>PCI: 4 x 20 mm Synergy DES 1st R PL 70% EF 45-50%  Acute inferior wall ST elevation myocardial infarction due to high-grade mid right coronary obstruction with likely distal embolization to the microcirculation.  Successful PTCA and stenting of the mid right coronary from 99% to 0% with TIMI grade 3 flow. Residual 50-70% left ventricular branch obstruction is noted.  Moderate distal left main, moderate first diagonal stenosis, mild-to-moderate diffuse mid LAD, and moderately severe first obtuse marginal  Low normal left ventricular function with inferoapical hypokinesis. EF 45-50%.    Past Medical History  Diagnosis Date  .  Hyperlipidemia with target LDL less than 70 10/16/2014  . Essential hypertension 10/17/2014  . ST elevation myocardial infarction (STEMI) of inferior wall, initial episode of care ([place]) 10/16/2014  . 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 Presence of DES in RCA : Synergy 4.0 mm x 28 mm (4.5 mm) 10/17/2014  . Tobacco abuse 10/16/2014  . Chronic pain 10/16/2014    On Narcotics @ home.    Past Surgical History  Procedure Laterality Date  . Knee pain    . No past surgeries    . Cardiac catheterization N/A 10/16/2014    Procedure: Left Heart Cath and Coronary Angiography;  Surgeon: Belva Crome, MD;  Location: [facility];  Service: Cardiovascular;  LM ~45%, mRCA 99% (DES PCI), RPL3 70%, mLAD 50% & ostial D1 70%, OM1 75%  . Cardiac catheterization N/A 10/16/2014    Procedure: Coronary Stent Intervention;  Surgeon: Belva Crome, MD;  Location: [facility];  Service: Cardiovascular;  mRCA 99% --> Synergy DES 4.0 mm x 28 mm (4.5 mm)  . Transthoracic echocardiogram       Current Outpatient Prescriptions  Medication Sig Dispense Refill  . aspirin EC 81 MG EC tablet Take 1 tablet (81 mg total) by mouth daily.    Marland Kitchen atorvastatin (LIPITOR) 80 MG tablet Take 1 tablet (80 mg total)  by mouth daily at 6 PM. 90 tablet 3  . gabapentin (NEURONTIN) 300 MG capsule Take 300 mg by mouth 3 (three) times daily.    Marland Kitchen HYDROcodone-acetaminophen (NORCO) 10-325 MG tablet Take 1 tablet by mouth every 6 (six) hours as needed. pain 60 tablet 0  . lisinopril (PRINIVIL,ZESTRIL) 5 MG tablet Take 1 tablet (5 mg total) by mouth daily. 90 tablet 3  . metoprolol tartrate (LOPRESSOR) 25 MG tablet Take 1 tablet (25 mg total) by mouth 2 (two) times daily. 60 tablet 11  . nicotine (NICODERM CQ - DOSED IN MG/24 HOURS) 21 mg/24hr patch Place 1 patch (21 mg total) onto the skin daily. 28 patch 0   . nitroGLYCERIN (NITROSTAT) 0.4 MG SL tablet Place 1 tablet (0.4 mg total) under the tongue every 5 (five) minutes x 3 doses as needed for chest pain. 25 tablet 3  . ticagrelor (BRILINTA) 90 MG TABS tablet Take 1 tablet (90 mg total) by mouth 2 (two) times daily. 60 tablet 11  . tiZANidine (ZANAFLEX) 4 MG tablet Take 4 mg by mouth every 8 (eight) hours as needed for muscle spasms.      No current facility-administered medications for this visit.    Allergies:   Review of patient's allergies indicates no known allergies.    Social History:  The patient  reports that he has been smoking.  He does not have any smokeless tobacco history on file. He reports that he does not drink alcohol or use illicit drugs.   Family History:  The patient's family history includes Heart attack (age of onset: 47) in his father; Hyperlipidemia in his father; Hypertension in his father.    ROS:   Please see the history of present illness.   Review of Systems  Musculoskeletal: Positive for joint pain.  Psychiatric/Behavioral: The patient has insomnia and is nervous/anxious.   All other systems reviewed and are negative.     PHYSICAL EXAM: VS:  Ht 5\' 10"  (1.778 m)    Wt Readings from Last 3 Encounters:  10/23/14 197 lb (89.359 kg)  10/18/14 192 lb 14.4 oz (87.499 kg)  02/17/14 201 lb (91.173 kg)     GEN: Well nourished, well developed, in no acute distress HEENT: normal Neck: no JVD, no masses Cardiac:  Normal S1/S2, RRR; no murmur ,  no rubs or gallops, no edema; right wrist without hematoma or mass  Respiratory:  clear to auscultation bilaterally, no wheezing, rhonchi or rales. GI: soft, nontender, nondistended, + BS MS: no deformity or atrophy Skin: warm and dry  Neuro:  CNs II-XII intact, Strength and sensation are intact Psych: Normal affect   EKG:  EKG is ordered today.  It demonstrates:   NSR, HR 75, inf Q waves, inc RBBB, QTc 413 ms, no significant changes since prior tracing   Recent  Labs: 10/17/2014: BUN 6; Creatinine, Ser 0.94; Hemoglobin 12.5*; Platelets 230; Potassium 3.6; Sodium 135    Lipid Panel    Component Value Date/Time   CHOL 174 10/17/2014 0147   TRIG 210* 10/17/2014 0147   HDL 25* 10/17/2014 0147   CHOLHDL 7.0 10/17/2014 0147   VLDL 42* 10/17/2014 0147   LDLCALC 107* 10/17/2014 0147      ASSESSMENT AND PLAN:  1. CAD:  S/p recent inferior STEMI treated with Synergy DES to the RCA. He has residual disease in the left main, LCx and LAD. This is to be treated medically. He is currently not experiencing further angina. He is on a good medical  regimen which includes aspirin, Brilinta, Lipitor 80, lisinopril, Lopressor. I have encouraged him to start cardiac rehabilitation. I have also stressed the importance of tobacco cessation.  2. Hyperlipidemia: Continue high-dose statin. Arrange follow-up lipids and LFTs in 6 weeks.  3. Hypertension: Blood pressure well controlled.  4. Tobacco Abuse: We discussed the importance of tobacco cessation today. We discussed different strategies for quitting. I spent 3-5 minutes counseling him on tobacco cessation today.  5. Anxiety: I have asked him to follow-up with primary care for further management.     Medication Changes: Current medicines are reviewed at length with the patient today.  Concerns regarding medicines are as outlined above.  The following changes have been made:   Discontinued Medications   No medications on file   Modified Medications   No medications on file   New Prescriptions   No medications on file   Labs/ tests ordered today include:   Orders Placed This Encounter  Procedures  . Lipid Profile  . Hepatic function panel  . EKG 12-Lead     Disposition:    FU with Dr. Daneen Schick 2 mos.     Signed, Versie Starks, MHS 10/25/2014 1:22 PM    [facility] [place], [place], [place]  [address] Phone: [phone]; Fax: [phone]

## 2014-10-25 ENCOUNTER — Encounter: Payer: Self-pay | Admitting: Physician Assistant

## 2014-10-25 ENCOUNTER — Ambulatory Visit: Payer: Self-pay

## 2014-10-25 ENCOUNTER — Ambulatory Visit (INDEPENDENT_AMBULATORY_CARE_PROVIDER_SITE_OTHER): Payer: Self-pay | Admitting: Physician Assistant

## 2014-10-25 VITALS — Ht 70.0 in

## 2014-10-25 DIAGNOSIS — I2510 Atherosclerotic heart disease of native coronary artery without angina pectoris: Secondary | ICD-10-CM

## 2014-10-25 DIAGNOSIS — Z720 Tobacco use: Secondary | ICD-10-CM

## 2014-10-25 DIAGNOSIS — E785 Hyperlipidemia, unspecified: Secondary | ICD-10-CM

## 2014-10-25 DIAGNOSIS — F419 Anxiety disorder, unspecified: Secondary | ICD-10-CM

## 2014-10-25 DIAGNOSIS — I10 Essential (primary) hypertension: Secondary | ICD-10-CM

## 2014-10-25 NOTE — Patient Instructions (Addendum)
Medication Instructions:  No changes.   Labwork: Schedule in 6 weeks:  FASTING Lipids, LFTs  Testing/Procedures: None   Follow-Up: Dr. Daneen Schick 2 mos.  Any Other Special Instructions Will Be Listed Below (If Applicable).  If the financial counselor is not able to get you assistance with Brilinta: 1. Call us for samples. You cannot run out of Brilinta. 2. Call [phone] or visit [email or web address] to get assistance affording Brilinta.

## 2014-11-01 ENCOUNTER — Ambulatory Visit: Payer: Self-pay

## 2014-11-19 ENCOUNTER — Encounter (HOSPITAL_COMMUNITY): Payer: Self-pay | Admitting: Emergency Medicine

## 2014-11-19 ENCOUNTER — Emergency Department (INDEPENDENT_AMBULATORY_CARE_PROVIDER_SITE_OTHER)
Admission: EM | Admit: 2014-11-19 | Discharge: 2014-11-19 | Disposition: A | Discharge status: Home / Self Care | Payer: Self-pay | Source: Home / Self Care | Attending: Family Medicine | Admitting: Family Medicine

## 2014-11-19 ENCOUNTER — Telehealth: Payer: Self-pay | Admitting: Interventional Cardiology

## 2014-11-19 DIAGNOSIS — F1193 Opioid use, unspecified with withdrawal: Secondary | ICD-10-CM

## 2014-11-19 DIAGNOSIS — G8929 Other chronic pain: Secondary | ICD-10-CM

## 2014-11-19 DIAGNOSIS — I2510 Atherosclerotic heart disease of native coronary artery without angina pectoris: Secondary | ICD-10-CM

## 2014-11-19 DIAGNOSIS — F419 Anxiety disorder, unspecified: Secondary | ICD-10-CM

## 2014-11-19 DIAGNOSIS — F1190 Opioid use, unspecified, uncomplicated: Secondary | ICD-10-CM

## 2014-11-19 DIAGNOSIS — F1123 Opioid dependence with withdrawal: Secondary | ICD-10-CM

## 2014-11-19 DIAGNOSIS — Z720 Tobacco use: Secondary | ICD-10-CM

## 2014-11-19 DIAGNOSIS — M7918 Myalgia, other site: Secondary | ICD-10-CM

## 2014-11-19 NOTE — ED Notes (Signed)
Multiple complaints: reports chronic hip, knee, neck and lower back pain, but never has had a PCP.  Patient reports he has been managed by pain clinic in [place].  Patient no longer wants to be a part of this practice, wants another referral for pain clinic.  Patient has run out of pain medicine.  And, patient furthers his plea for pain medication siting that the Internet reports typical dosing is higher than prescribed. "written for 4 a day.Marland KitchenMarland KitchenMarland KitchenInternet says 6 a day is normal".   Patient reports he is withdrawing from pain medicine.  Reports symptoms for 2 days.  Patient reports diarrhea, chills, inability to sleep.   Patient has multiple copies of medical records.

## 2014-11-19 NOTE — Telephone Encounter (Signed)
New message      Pt said he does not feel well.  He is tired, anxious.  Denies sob and chest pain.  He request to talk to a nurse

## 2014-11-19 NOTE — Discharge Instructions (Signed)
Chronic Pain  Chronic pain can be defined as pain that is off and on and lasts for 3-6 months or longer. Many things cause chronic pain, which can make it difficult to make a diagnosis. There are many treatment options available for chronic pain. However, finding a treatment that works well for you may require trying various approaches until the right one is found. Many people benefit from a combination of two or more types of treatment to control their pain.  SYMPTOMS   Chronic pain can occur anywhere in the body and can range from mild to very severe. Some types of chronic pain include:  · Headache.  · Low back pain.  · Cancer pain.  · Arthritis pain.  · Neurogenic pain. This is pain resulting from damage to nerves.   People with chronic pain may also have other symptoms such as:  · Depression.  · Anger.  · Insomnia.  · Anxiety.  DIAGNOSIS   Your health care provider will help diagnose your condition over time. In many cases, the initial focus will be on excluding possible conditions that could be causing the pain. Depending on your symptoms, your health care provider may order tests to diagnose your condition. Some of these tests may include:   · Blood tests.    · CT scan.    · MRI.    · X-rays.    · Ultrasounds.    · Nerve conduction studies.    You may need to see a specialist.   TREATMENT   Finding treatment that works well may take time. You may be referred to a pain specialist. He or she may prescribe medicine or therapies, such as:   · Mindful meditation or yoga.  · Shots (injections) of numbing or pain-relieving medicines into the spine or area of pain.  · Local electrical stimulation.  · Acupuncture.    · Massage therapy.    · Aroma, color, light, or sound therapy.    · Biofeedback.    · Working with a physical therapist to keep from getting stiff.    · Regular, gentle exercise.    · Cognitive or behavioral therapy.    · Group support.    Sometimes, surgery may be recommended.   HOME CARE INSTRUCTIONS    · Take all medicines as directed by your health care provider.    · Lessen stress in your life by relaxing and doing things such as listening to calming music.    · Exercise or be active as directed by your health care provider.    · Eat a healthy diet and include things such as vegetables, fruits, fish, and lean meats in your diet.    · Keep all follow-up appointments with your health care provider.    · Attend a support group with others suffering from chronic pain.  SEEK MEDICAL CARE IF:   · Your pain gets worse.    · You develop a new pain that was not there before.    · You cannot tolerate medicines given to you by your health care provider.    · You have new symptoms since your last visit with your health care provider.    SEEK IMMEDIATE MEDICAL CARE IF:   · You feel weak.    · You have decreased sensation or numbness.    · You lose control of bowel or bladder function.    · Your pain suddenly gets much worse.    · You develop shaking.  · You develop chills.  · You develop confusion.  · You develop chest pain.  · You develop shortness of breath.    MAKE SURE YOU:  ·   Document Revised: 09/07/2012 Document Reviewed: 07/01/2012 Elsevier Interactive Patient Education 2016 [place].  Opioid Withdrawal Opioids are a group of narcotic drugs. They include the street drug heroin. They also include pain medicines, such as morphine, hydrocodone, oxycodone, and fentanyl. Opioid withdrawal is a group of characteristic physical and mental signs and symptoms. It typically occurs if you have been using opioids daily for several weeks or longer and stop using or rapidly decrease use.  Opioid withdrawal can also occur if you have used opioids daily for a long time and are given a medicine to block the effect.  SIGNS AND SYMPTOMS Opioid withdrawal includes three or more of the following symptoms:   Depressed, anxious, or irritable mood.  Nausea or vomiting.  Muscle aches or spasms.   Watery eyes.   Runny nose.  Dilated pupils, sweating, or hairs standing on end.  Diarrhea or intestinal cramping.  Yawning.   Fever.  Increased blood pressure.  Fast pulse.  Restlessness or trouble sleeping. These signs and symptoms occur within several hours of stopping or reducing short-acting opioids, such as heroin. They can occur within 3 days of stopping or reducing long-acting opioids, such as methadone. Withdrawal begins within minutes of receiving a drug that blocks the effects of opioids, such as naltrexone or naloxone. DIAGNOSIS  Opioid use disorder is diagnosed by your health care provider. You will be asked about your symptoms, drug and alcohol use, medical history, and use of medicines. A physical exam may be done. Lab tests may be ordered. Your health care provider may have you see a mental health professional.  TREATMENT  The treatment for opioid withdrawal is usually provided by medical doctors with special training in substance use disorders (addiction specialists). The following medicines may be included in treatment:  Opioids given in place of the abused opioid. They turn on opioid receptors in the brain and lessen or prevent withdrawal symptoms. They are gradually decreased (opioid substitution and taper).  Non-opioids that can lessen certain opioid withdrawal symptoms. They may be used alone or with opioid substitution and taper. Successful long-term recovery usually requires medicine, counseling, and group support. HOME CARE INSTRUCTIONS   Take medicines only as directed by your health care provider.  Check with your health care provider before  starting new medicines.  Keep all follow-up visits as directed by your health care provider. SEEK MEDICAL CARE IF:  You are not able to take your medicines as directed.  Your symptoms get worse.  You relapse. SEEK IMMEDIATE MEDICAL CARE IF:  You have serious thoughts about hurting yourself or others.  You have a seizure.  You lose consciousness.   This information is not intended to replace advice given to you by your health care provider. Make sure you discuss any questions you have with your health care provider.   Document Released: 01/08/2003 Document Revised: 01/26/2014 Document Reviewed: 01/18/2013 Elsevier Interactive Patient Education 2016 [place].  Opioid Use Disorder Opioid use disorder is a mental disorder. It is the continued nonmedical use of opioids in spite of risks to health and well-being. Misused opioids include the street drug heroin. They also include pain medicines such as morphine, hydrocodone, oxycodone, and fentanyl. Opioids are very addictive. People who misuse opioids get an exaggerated feeling of well-being. Opioid use disorder often disrupts activities at home, work, or school. It may cause mental or physical problems.  A family history of opioid use disorder puts you at higher risk of it. People with opioid use disorder often misuse  other drugs or have mental illness such as depression, posttraumatic stress disorder, or antisocial personality disorder. They also are at risk of suicide and death from overdose. SIGNS AND SYMPTOMS  Signs and symptoms of opioid use disorder include:  Use of opioids in larger amounts or over a longer period than intended.  Unsuccessful attempts to cut down or control opioid use.  A lot of time spent obtaining, using, or recovering from the effects of opioids.  A strong desire or urge to use opioids (craving).  Continued use of opioids in spite of major problems at work, school, or home because of use.  Continued  use of opioids in spite of relationship problems because of use.  Giving up or cutting down on important life activities because of opioid use.  Use of opioids over and over in situations when it is physically hazardous, such as driving a car.  Continued use of opioids in spite of a physical problem that is likely related to use. Physical problems can include:  Severe constipation.  Poor nutrition.  Infertility.  Tuberculosis.  Aspiration pneumonia.  Infections such as human immunodeficiency virus (HIV) and hepatitis (from injecting opioids).  Continued use of opioids in spite of a mental problem that is likely related to use. Mental problems can include:  Depression.  Anxiety.  Hallucinations.  Sleep problems.  Loss of sexual function.  Need to use more and more opioids to get the same effect, or lessened effect over time with use of the same amount (tolerance).  Having withdrawal symptoms when opioid use is stopped, or using opioids to reduce or avoid withdrawal symptoms. Withdrawal symptoms include:  Depressed, anxious, or irritable mood.  Nausea, vomiting, diarrhea, or intestinal cramping.  Muscle aches or spasms.  Excessive tearing or runny nose.  Dilated pupils, sweating, or hairs standing on end.  Yawning.  Fever, raised blood pressure, or fast pulse.  Restlessness or trouble sleeping. This does not apply to people taking opioids for medical reasons only. DIAGNOSIS Opioid use disorder is diagnosed by your health care provider. You may be asked questions about your opioid use and and how it affects your life. A physical exam may be done. A drug screen may be ordered. You may be referred to a mental health professional. The diagnosis of opioid use disorder requires at least two symptoms within 12 months. The type of opioid use disorder you have depends on the number of signs and symptoms you have. The type may be:  Mild. Two or three signs and symptoms.    Moderate. Four or five signs and symptoms.   Severe. Six or more signs and symptoms. TREATMENT  Treatment is usually provided by mental health professionals with training in substance use disorders.The following options are available:  Detoxification.This is the first step in treatment for withdrawal. It is medically supervised withdrawal with the use of medicines. These medicines lessen withdrawal symptoms. They also raise the chance of becoming opioid free.  Counseling, also known as talk therapy. Talk therapy addresses the reasons you use opioids. It also addresses ways to keep you from using again (relapse). The goals of talk therapy are to avoid relapse by:  Identifying and avoiding triggers for use.  Finding healthy ways to cope with stress.  Learning how to handle cravings.  Support groups. Support groups provide emotional support, advice, and guidance.  A medicine that blocks opioid receptors in your brain. This medicine can reduce opioid cravings that lead to relapse. This medicine also blocks the desired  opioid effect when relapse occurs.  Opioids that are taken by mouth in place of the misused opioid (opioid maintenance treatment). These medicines satisfy cravings but are safer than commonly misused opioids. This often is the best option for people who continue to relapse with other treatments. HOME CARE INSTRUCTIONS   Take medicines only as directed by your health care provider.  Check with your health care provider before starting new medicines.  Keep all follow-up visits as directed by your health care provider. SEEK MEDICAL CARE IF:  You are not able to take your medicines as directed.  Your symptoms get worse. SEEK IMMEDIATE MEDICAL CARE IF:  You have serious thoughts about hurting yourself or others.  You may have taken an overdose of opioids. [place] on Drug Abuse: motorcyclefax.com  Substance Abuse and Mental  Health Services Administration: ktimeonline.com   This information is not intended to replace advice given to you by your health care provider. Make sure you discuss any questions you have with your health care provider.   Document Released: 11/02/2006 Document Revised: 01/26/2014 Document Reviewed: 01/18/2013 Elsevier Interactive Patient Education 2016 [employer].  Smoking Hazards Smoking cigarettes is extremely bad for your health. Tobacco smoke has over 200 known poisons in it. It contains the poisonous gases nitrogen oxide and carbon monoxide. There are over 60 chemicals in tobacco smoke that cause cancer. Some of the chemicals found in cigarette smoke include:   Cyanide.   Benzene.   Formaldehyde.   Methanol (wood alcohol).   Acetylene (fuel used in welding torches).   Ammonia.  Even smoking lightly shortens your life expectancy by several years. You can greatly reduce the risk of medical problems for you and your family by stopping now. Smoking is the most preventable cause of death and disease in our society. Within days of quitting smoking, your circulation improves, you decrease the risk of having a heart attack, and your lung capacity improves. There may be some increased phlegm in the first few days after quitting, and it may take months for your lungs to clear up completely. Quitting for 10 years reduces your risk of developing lung cancer to almost that of a nonsmoker.  WHAT ARE THE RISKS OF SMOKING? Cigarette smokers have an increased risk of many serious medical problems, including:  Lung cancer.   Lung disease (such as pneumonia, bronchitis, and emphysema).   Heart attack and chest pain due to the heart not getting enough oxygen (angina).   Heart disease and peripheral blood vessel disease.   Hypertension.   Stroke.   Oral cancer (cancer of the lip, mouth, or voice box).   Bladder cancer.   Pancreatic cancer.   Cervical cancer.    Pregnancy complications, including premature birth.   Stillbirths and smaller newborn babies, birth defects, and genetic damage to sperm.   Early menopause.   Lower estrogen level for women.   Infertility.   Facial wrinkles.   Blindness.   Increased risk of broken bones (fractures).   Senile dementia.   Stomach ulcers and internal bleeding.   Delayed wound healing and increased risk of complications during surgery. Because of secondhand smoke exposure, children of smokers have an increased risk of the following:   Sudden infant death syndrome (SIDS).   Respiratory infections.   Lung cancer.   Heart disease.   Ear infections.  WHY IS SMOKING ADDICTIVE? Nicotine is the chemical agent in tobacco that is capable of causing addiction or dependence. When you smoke  and inhale, nicotine is absorbed rapidly into the bloodstream through your lungs. Both inhaled and noninhaled nicotine may be addictive.  WHAT ARE THE BENEFITS OF QUITTING?  There are many health benefits to quitting smoking. Some are:   The likelihood of developing cancer and heart disease decreases. Health improvements are seen almost immediately.   Blood pressure, pulse rate, and breathing patterns start returning to normal soon after quitting.   People who quit may see an improvement in their overall quality of life.  HOW DO YOU QUIT SMOKING? Smoking is an addiction with both physical and psychological effects, and longtime habits can be hard to change. Your health care provider can recommend:  Programs and community resources, which may include group support, education, or therapy.  Replacement products, such as patches, gum, and nasal sprays. Use these products only as directed. Do not replace cigarette smoking with electronic cigarettes (commonly called e-cigarettes). The safety of e-cigarettes is unknown, and some may contain harmful chemicals. FOR MORE INFORMATION  American Lung  Association: www.lung.org  American Cancer Society: www.cancer.org   This information is not intended to replace advice given to you by your health care provider. Make sure you discuss any questions you have with your health care provider.   Document Released: 02/13/2004 Document Revised: 10/26/2012 Document Reviewed: 06/27/2012 Elsevier Interactive Patient Education [employer].

## 2014-11-19 NOTE — ED Provider Notes (Signed)
CSN: [id number]     Arrival date & time 11/19/14  1419 History   First MD Initiated Contact with Patient 11/19/14 1637     Chief Complaint  Patient presents with  . Pain   (Consider location/radiation/quality/duration/timing/severity/associated sxs/prior Treatment) HPI Comments: 49 year old male with a history of chronic most the skeletal pain. He is been attending up pain clinic which has been administering narcotics every 2-4 weeks for the past 2-1/2 years. These medicines include tramadol, hydrocodone and Percocet. He states that due to the length of time he has been on these medications he has developed a tolerance and needs higher doses. For reasons that he did not explain he is no longer seen in the pain clinic. His last tox the code on 10 mg tablet was 2 days ago. He is feeling very anxious, having diarrhea, sleeplessness, anxiety and palpitations. He denies having chest pain or shortness of breath. He was advised to go either to the emergency department or urgent care if he was having withdrawal symptoms. He is also confused about obtaining a primary care physician. He has never had one. He is requesting a refill of medication to get him through until he can find a doctor however he has not made any attempts to obtain a physician or Medicaid. Have performed a [place] controlled substance reporting system review which demonstrates regular opioids medications of  hydrocodone 10 mg and oxycodone 10 mg and tramadol on a regular basis for the past year. His last refills were on October 11. He has smoked 2 packs of cigarettes per day for several years. In September he had an MI treated with stents.   Past Medical History  Diagnosis Date  . Hyperlipidemia with target LDL less than 70 10/16/2014  . Essential hypertension 10/17/2014  . ST elevation myocardial infarction (STEMI) of inferior wall, initial episode of care ([place]) 10/16/2014  . 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 Presence of DES in RCA : Synergy 4.0 mm x 28 mm (4.5 mm) 10/17/2014  . Tobacco abuse 10/16/2014  . Chronic pain 10/16/2014    On Narcotics @ home.   Past Surgical History  Procedure Laterality Date  . Knee pain    . No past surgeries    . Cardiac catheterization N/A 10/16/2014    Procedure: Left Heart Cath and Coronary Angiography;  Surgeon: Belva Crome, MD;  Location: [facility];  Service: Cardiovascular;  LM ~45%, mRCA 99% (DES PCI), RPL3 70%, mLAD 50% & ostial D1 70%, OM1 75%  . Cardiac catheterization N/A 10/16/2014    Procedure: Coronary Stent Intervention;  Surgeon: Belva Crome, MD;  Location: [facility];  Service: Cardiovascular;  mRCA 99% --> Synergy DES 4.0 mm x 28 mm (4.5 mm)  . Transthoracic echocardiogram     Family History  Problem Relation Age of Onset  . Heart attack Father 49  . Hyperlipidemia Father   . Hypertension Father    Social History  Substance Use Topics  . Smoking status: Current Every Day Smoker -- 2.00 packs/day  . Smokeless tobacco: None     Comment: 2 ppd  . Alcohol Use: No    Review of Systems  Constitutional: Positive for activity change. Negative for fever and fatigue.  HENT: Negative.   Respiratory: Negative for cough and shortness of breath.   Cardiovascular:  Negative for chest pain and leg swelling.  Gastrointestinal: Negative for nausea, vomiting and abdominal pain.  Genitourinary: Negative.   Neurological: Positive for light-headedness. Negative for tremors, syncope, speech difficulty and weakness.  Psychiatric/Behavioral: Positive for sleep disturbance and agitation. The patient is nervous/anxious.     Allergies  Review of patient's allergies indicates no known allergies.  Home Medications   Prior to Admission medications   Medication Sig Start Date End Date Taking? Authorizing Provider  aspirin EC 81 MG EC  tablet Take 1 tablet (81 mg total) by mouth daily. 10/18/14   Almyra Deforest, PA  atorvastatin (LIPITOR) 80 MG tablet Take 1 tablet (80 mg total) by mouth daily at 6 PM. 10/18/14   Almyra Deforest, PA  gabapentin (NEURONTIN) 300 MG capsule Take 300 mg by mouth 3 (three) times daily.    Historical Provider, MD  HYDROcodone-acetaminophen (NORCO) 10-325 MG tablet Take 1 tablet by mouth every 6 (six) hours as needed. pain 10/18/14   Almyra Deforest, PA  lisinopril (PRINIVIL,ZESTRIL) 5 MG tablet Take 1 tablet (5 mg total) by mouth daily. 10/18/14   Almyra Deforest, PA  metoprolol tartrate (LOPRESSOR) 25 MG tablet Take 1 tablet (25 mg total) by mouth 2 (two) times daily. 10/18/14   Almyra Deforest, PA  nicotine (NICODERM CQ - DOSED IN MG/24 HOURS) 21 mg/24hr patch Place 1 patch (21 mg total) onto the skin daily. 10/18/14   Almyra Deforest, PA  nitroGLYCERIN (NITROSTAT) 0.4 MG SL tablet Place 1 tablet (0.4 mg total) under the tongue every 5 (five) minutes x 3 doses as needed for chest pain. 10/18/14   Almyra Deforest, PA  ticagrelor (BRILINTA) 90 MG TABS tablet Take 1 tablet (90 mg total) by mouth 2 (two) times daily. 10/23/14   Arnoldo Morale, MD  tiZANidine (ZANAFLEX) 4 MG tablet Take 4 mg by mouth every 8 (eight) hours as needed for muscle spasms.     Historical Provider, MD   Meds Ordered and Administered this Visit  Medications - No data to display  BP 127/90 mmHg  Pulse 92  Temp(Src) 98.1 F (36.7 C) (Oral)  Resp 16  SpO2 98% No data found.   Physical Exam  Constitutional: He appears well-developed and well-nourished. No distress.  Eyes: EOM are normal.  Neck: Normal range of motion. Neck supple.  Cardiovascular: Normal rate, regular rhythm and normal heart sounds.   Pulmonary/Chest: Effort normal and breath sounds normal.  Rare expiratory wheeze bilaterally. Fair air movement. No tachypnea or increased effort.  Musculoskeletal: He exhibits no edema.  Neurological: He is alert. He exhibits normal muscle tone. Coordination normal.  Skin: Skin  is warm and dry.  Psychiatric: His speech is normal. His mood appears anxious. His affect is not angry. He is not aggressive. Cognition and memory are normal.  Nursing note and vitals reviewed.   ED Course  Procedures (including critical care time)  Labs Review Labs Reviewed - No data to display  Imaging Review No results found.   Visual Acuity Review  Right Eye Distance:   Left Eye Distance:   Bilateral Distance:    Right Eye Near:   Left Eye Near:    Bilateral Near:         MDM   1. Chronic pain   2. Chronic, continuous use of opioids   3. Musculoskeletal pain   4. Anxiety   5. Coronary artery disease involving native coronary artery of native heart without angina pectoris   6. Tobacco abuse disorder   7. Opioid  withdrawal [facility])    Patient was having no chest pain or shortness of breath. He was primarily interested in obtaining enough medication to keep him from going into withdrawal and help with his pain medicine. Percocet 10 mg is not sufficiently strong enough for him. He is advised that he must obtain a PCP and then referred to a pain clinic which is more to his liking. Unfortunately, the urgent care is unable to provide any narcotics for chronic pain or anxiety or medications for withdrawal. The patient is strongly urged to go to the emergency department. Is also advised that [facility] has  psychiatric facilities in the able to assist for today and obtain help for anxiety and withdrawal. He is advised not to go home but to have his friend take him to the hospital. The patient left without receiving written instructions.    Janne Napoleon, NP 11/19/14 1728

## 2014-11-19 NOTE — Telephone Encounter (Signed)
Returned pt call. Pt sts that he has been having withdrawal symptoms since d/s his prescribed pain medication. Pt sts that he has been having cold sweats (diaphoresis) , palpitations, nausea  and feels really anxious. Pt denies chest pian, sob, fever, vomiting, swelling. Pt unable to provide any vital signs. Pt was seen on 10/6 by Margaret Pyle who recommended pt f/u with his pcp for anxiety. Adv pt that he should f/u with his pcp asap, we  Not be able to treat him for anxiety and withdrawal. Pt sts that he is in [facility] urgent care parking lot and was going there to get evaluated. Adv pt I will talk with a provider in the office, and call back with their recommendation.   Pt aware of Kathrene Alu recommendation pt should go to [facility] ED or urgent care to be evaluated. Pt sts the he is currently at urgent care.  Adv pt f/u with [place] if cardiac symptoms develop. Pt verbalized understanding.

## 2014-11-19 NOTE — ED Notes (Signed)
Patient left after seeing provider.  Did not wait for paperwork

## 2014-11-19 NOTE — Telephone Encounter (Signed)
F/u  Pt following up- requests to speak w/ Rn. Please call back and discuss.

## 2014-12-04 ENCOUNTER — Encounter: Payer: Self-pay | Admitting: Interventional Cardiology

## 2014-12-06 ENCOUNTER — Other Ambulatory Visit (INDEPENDENT_AMBULATORY_CARE_PROVIDER_SITE_OTHER): Payer: Self-pay | Admitting: *Deleted

## 2014-12-06 ENCOUNTER — Telehealth: Payer: Self-pay

## 2014-12-06 DIAGNOSIS — I2510 Atherosclerotic heart disease of native coronary artery without angina pectoris: Secondary | ICD-10-CM

## 2014-12-06 LAB — LIPID PANEL
CHOLESTEROL: 99 mg/dL — AB (ref 125–200)
HDL: 24 mg/dL — ABNORMAL LOW (ref >=40)
LDL CALC: 48 mg/dL (ref <130)
TRIGLYCERIDES: 135 mg/dL (ref <150)
Total CHOL/HDL Ratio: 4.1 Ratio (ref <=5.0)
VLDL: 27 mg/dL (ref <30)

## 2014-12-06 LAB — HEPATIC FUNCTION PANEL
ALK PHOS: 71 U/L (ref 40–115)
ALT: 22 U/L (ref 9–46)
AST: 15 U/L (ref 10–40)
Albumin: 4.1 g/dL (ref 3.6–5.1)
BILIRUBIN DIRECT: 0.1 mg/dL (ref <=0.2)
BILIRUBIN INDIRECT: 0.6 mg/dL (ref 0.2–1.2)
TOTAL PROTEIN: 7.1 g/dL (ref 6.1–8.1)
Total Bilirubin: 0.7 mg/dL (ref 0.2–1.2)

## 2014-12-06 NOTE — Telephone Encounter (Signed)
Patient walks in today requesting samples of Brilinta 90mg . He has no Insurance. Has an appointment with a Education officer, museum today re: patient assistance. 6 bottles of Brilinta 90mg  provided.

## 2015-01-04 ENCOUNTER — Ambulatory Visit: Payer: Self-pay | Admitting: Interventional Cardiology

## 2015-01-15 ENCOUNTER — Telehealth: Payer: Self-pay | Admitting: Family Medicine

## 2015-01-15 ENCOUNTER — Telehealth: Payer: Self-pay | Admitting: Interventional Cardiology

## 2015-01-15 NOTE — Telephone Encounter (Signed)
Pt is concerned about the price of ticagrelor (BRILINTA) 90 MG TABS tablet and not being able to refill this medication since he has to return to his pcp dr Jimmye Norman in [place]. Please follow up with pt to see if he can get this medication at a discounted rate at another pharmacy. Thank you.

## 2015-01-15 NOTE — Telephone Encounter (Signed)
Patient states he has spoken to our pharmacy and filled out paperwork to get the medication for free.  Patient reports feeling anxious and depressed with everything he has going on.  He denies suicidal ideation and wanted Dr. Jarold Song to prescribe anxiety medication.  Encourage patient to go to [facility] to be seen for his anxiety and depression.  Patient states that he could do that and RN encouraged him to go as soon as he can.

## 2015-01-15 NOTE — Telephone Encounter (Signed)
Pt walked into the clinic wanting samples of Brilinta 90 mg tablets. Pt was given two bottles a week supply and advised to call back next week to see if we have gotten anymore samples in.

## 2015-01-18 ENCOUNTER — Other Ambulatory Visit: Payer: Self-pay | Admitting: Family Medicine

## 2015-01-18 MED ORDER — TICAGRELOR 90 MG PO TABS: 90.0000 mg | ORAL_TABLET | Freq: Two times a day (BID) | ORAL | Status: DC

## 2015-01-23 MED FILL — **BRILINTA 90 MG TABLET: 90 | 15 days supply | Qty: 30 | Fill #0

## 2015-01-28 ENCOUNTER — Encounter: Payer: Self-pay | Admitting: Interventional Cardiology

## 2015-01-28 ENCOUNTER — Ambulatory Visit (INDEPENDENT_AMBULATORY_CARE_PROVIDER_SITE_OTHER): Payer: Self-pay | Admitting: Interventional Cardiology

## 2015-01-28 VITALS — BP 118/72 | HR 107 | Ht 70.0 in | Wt 197.2 lb

## 2015-01-28 DIAGNOSIS — Z720 Tobacco use: Secondary | ICD-10-CM

## 2015-01-28 DIAGNOSIS — E785 Hyperlipidemia, unspecified: Secondary | ICD-10-CM

## 2015-01-28 DIAGNOSIS — I10 Essential (primary) hypertension: Secondary | ICD-10-CM

## 2015-01-28 DIAGNOSIS — I2510 Atherosclerotic heart disease of native coronary artery without angina pectoris: Secondary | ICD-10-CM

## 2015-01-28 NOTE — Patient Instructions (Signed)
Medication Instructions:  Your physician recommends that you continue on your current medications as directed. Please refer to the Current Medication list given to you today.   Labwork: None ordered  Testing/Procedures: None ordered  Follow-Up: Your physician wants you to follow-up in: 4 months with Dr.Smith You will receive a reminder letter in the mail two months in advance. If you don't receive a letter, please call our office to schedule the follow-up appointment.    Any Other Special Instructions Will Be Listed Below (If Applicable). Your physician discussed the hazards of tobacco use. Tobacco use cessation is recommended and techniques and options to help you quit were discussed.   Your physician discussed the importance of regular exercise and recommended that you start or continue a regular exercise program for good health.      If you need a refill on your cardiac medications before your next appointment, please call your pharmacy.

## 2015-01-28 NOTE — Progress Notes (Signed)
Cardiology Office Note   Date:  01/28/2015   ID:  Damon Smith, DOB 08/10/1965, MRN [id number]  PCP:  Harvie Junior, MD  Cardiologist:  Sinclair Grooms, MD   Chief Complaint  Patient presents with  . Coronary Artery Disease      History of Present Illness: Damon Smith is a 50 y.o. male who presents for  Tobacco abuse, essential hypertension, hyperlipidemia, multivessel CAD, an RCA stent during acute infarction September 2016.   Patient has significant coronary disease, continues to smokes cigarettes. He has difficulty obtaining his medications. He is being seen at the [facility] outpatient clinic samples of medications are being provided. He has not had angina. He has concerns about homelessness and hunger. Nitroglycerin has not been needed. He has applied for Medicaid.    Past Medical History  Diagnosis Date  . Hyperlipidemia with target LDL less than 70 10/16/2014  . Essential hypertension 10/17/2014  . ST elevation myocardial infarction (STEMI) of inferior wall, initial episode of care ([place]) 10/16/2014  . Coronary artery disease involving native heart with unstable angina pectoris ([place]) 10/17/2014    LM ~45%, mRCA 99% (DES PCI), RPL3 70%, mLAD 50% & ostial D1 70%, OM1 75%  . Presence of DES in RCA : Synergy 4.0 mm x 28 mm (4.5 mm) 10/17/2014  . Tobacco abuse 10/16/2014  . Chronic pain 10/16/2014    On Narcotics @ home.    Past Surgical History  Procedure Laterality Date  . Knee pain    . No past surgeries    . Cardiac catheterization N/A 10/16/2014    Procedure: Left Heart Cath and Coronary Angiography;  Surgeon: Belva Crome, MD;  Location: [facility];  Service: Cardiovascular;  LM ~45%, mRCA 99% (DES PCI), RPL3 70%, mLAD 50% & ostial D1 70%, OM1 75%  . Cardiac catheterization N/A 10/16/2014    Procedure: Coronary Stent Intervention;  Surgeon: Belva Crome, MD;  Location: [facility];  Service: Cardiovascular;  mRCA 99% --> Synergy DES 4.0 mm x  28 mm (4.5 mm)  . Transthoracic echocardiogram       Current Outpatient Prescriptions  Medication Sig Dispense Refill  . aspirin EC 81 MG EC tablet Take 1 tablet (81 mg total) by mouth daily.    Marland Kitchen HYDROcodone-acetaminophen (NORCO) 10-325 MG tablet Take 1 tablet by mouth every 6 (six) hours as needed. pain 60 tablet 0  . nitroGLYCERIN (NITROSTAT) 0.4 MG SL tablet Place 1 tablet (0.4 mg total) under the tongue every 5 (five) minutes x 3 doses as needed for chest pain. 25 tablet 3  . ticagrelor (BRILINTA) 90 MG TABS tablet Take 1 tablet (90 mg total) by mouth 2 (two) times daily. 180 tablet 3  . atorvastatin (LIPITOR) 80 MG tablet Take 1 tablet (80 mg total) by mouth daily at 6 PM. (Patient not taking: Reported on 01/28/2015) 90 tablet 3  . gabapentin (NEURONTIN) 300 MG capsule Take 300 mg by mouth 3 (three) times daily. Reported on 01/28/2015    . lisinopril (PRINIVIL,ZESTRIL) 5 MG tablet Take 1 tablet (5 mg total) by mouth daily. (Patient not taking: Reported on 01/28/2015) 90 tablet 3  . metoprolol tartrate (LOPRESSOR) 25 MG tablet Take 1 tablet (25 mg total) by mouth 2 (two) times daily. (Patient not taking: Reported on 01/28/2015) 60 tablet 11  . tiZANidine (ZANAFLEX) 4 MG tablet Take 4 mg by mouth every 8 (eight) hours as needed for muscle spasms. Reported on 01/28/2015  No current facility-administered medications for this visit.    Allergies:   Review of patient's allergies indicates no known allergies.    Social History:  The patient  reports that he has been smoking.  He has never used smokeless tobacco. He reports that he does not drink alcohol or use illicit drugs.   Family History:  The patient's family history includes Heart attack (age of onset: 91) in his father; Hyperlipidemia in his father; Hypertension in his father.    ROS:  Please see the history of present illness.   Otherwise, review of systems are positive for  Depression, hopelessness, excessive fatigue, anxiety, back  discomfort, muscle pain, dizziness, vision disturbance, and breathlessness after Brilinta..   All other systems are reviewed and negative.    PHYSICAL EXAM: VS:  BP 118/72 mmHg  Pulse 107  Ht 5\' 10"  (1.778 m)  Wt 197 lb 3.2 oz (89.449 kg)  BMI 28.30 kg/m2 , BMI Body mass index is 28.3 kg/(m^2). GEN: Well nourished, well developed, in no acute distress. Heavy smell of stale cigarette smoke HEENT: normal Neck: no JVD, carotid bruits, or masses Cardiac: RRR.  There is no murmur, rub, or gallop. There is no edema. Respiratory:  clear to auscultation bilaterally, normal work of breathing. GI: soft, nontender, nondistended, + BS MS: no deformity or atrophy Skin: warm and dry, no rash Neuro:  Strength and sensation are intact Psych: euthymic mood, full affect   EKG:  EKG  Is not ordered today.   Recent Labs: 10/17/2014: BUN 6; Creatinine, Ser 0.94; Hemoglobin 12.5*; Platelets 230; Potassium 3.6; Sodium 135 12/06/2014: ALT 22    Lipid Panel    Component Value Date/Time   CHOL 99* 12/06/2014 0757   TRIG 135 12/06/2014 0757   HDL 24* 12/06/2014 0757   CHOLHDL 4.1 12/06/2014 0757   VLDL 27 12/06/2014 0757   LDLCALC 48 12/06/2014 0757      Wt Readings from Last 3 Encounters:  01/28/15 197 lb 3.2 oz (89.449 kg)  10/23/14 197 lb (89.359 kg)  10/18/14 192 lb 14.4 oz (87.499 kg)      Other studies Reviewed: Additional studies/ records that were reviewed today include:  none. The findings include  none.    ASSESSMENT AND PLAN:  1. Coronary artery disease involving native coronary artery of native heart without angina pectoris  denies angina  2. Essential hypertension  Good control  3. Hyperlipidemia  LDL cholesterol 50 with total of 100 in November  4. Tobacco abuse  Continued heavy smoking    Current medicines are reviewed at length with the patient today.  The patient has the following concerns regarding medicines:  None other than difficulty with affording  therapy..  The following changes/actions have been instituted:     continue current medical regimen   Discontinue cigarette smoking   Aerobic activity  Labs/ tests ordered today include:  No orders of the defined types were placed in this encounter.     Disposition:   FU with HS in 4 months  Signed, Sinclair Grooms, MD  01/28/2015 5:06 PM    [facility] [address], [place], [place]  [address] Phone: [phone]; Fax: [phone]

## 2015-01-29 MED FILL — $BRILINTA 90 MG TABLET: 90 | 30 days supply | Qty: 60 | Fill #0

## 2015-02-01 MED FILL — GABAPENTIN 300 MG CAPSULE: 300 | 30 days supply | Qty: 90 | Fill #0

## 2015-02-01 MED FILL — LISINOPRIL 5 MG TABLET: 5 | 90 days supply | Qty: 90 | Fill #1

## 2015-02-01 MED FILL — METOPROLOL TARTRATE 25 MG T: 25 | 90 days supply | Qty: 180 | Fill #1

## 2015-02-01 MED FILL — ZOLPIDEM TARTRATE 10 MG TAB: 10 | 15 days supply | Qty: 15 | Fill #0

## 2015-02-01 MED FILL — tiZANidine HCL 4 MG TABS: 4 | 30 days supply | Qty: 60 | Fill #0

## 2015-02-01 MED FILL — ATORVASTATIN 80 MG TABLET: 80 | 30 days supply | Qty: 30 | Fill #3

## 2015-02-01 MED FILL — traMADol HCL 50 MG TABS: 50 | 30 days supply | Qty: 60 | Fill #0

## 2015-02-19 MED FILL — HYDROCODON-APAP 10-325: 10-325 | 30 days supply | Qty: 120 | Fill #0

## 2015-03-11 MED FILL — traMADol HCL 50 MG TABS: 50 | 30 days supply | Qty: 60 | Fill #0

## 2015-03-11 MED FILL — ATORVASTATIN 80 MG TABLET: 80 | 30 days supply | Qty: 30 | Fill #4

## 2015-03-11 MED FILL — ZOLPIDEM TARTRATE 10 MG TAB: 10 | 15 days supply | Qty: 15 | Fill #0

## 2015-03-19 MED FILL — tiZANidine HCL 4 MG TABS: 4 | 30 days supply | Qty: 60 | Fill #0

## 2015-03-27 MED FILL — $BRILINTA 90 MG TABLET: 90 | 30 days supply | Qty: 60 | Fill #1

## 2015-04-08 MED FILL — traMADol HCL 50 MG TABS: 50 | 30 days supply | Qty: 60 | Fill #0

## 2015-04-08 MED FILL — GABAPENTIN 300 MG CAPSULE: 300 | 30 days supply | Qty: 90 | Fill #1

## 2015-04-18 MED FILL — tiZANidine HCL 4 MG TABS: 4 | 30 days supply | Qty: 60 | Fill #0

## 2015-04-18 MED FILL — ATORVASTATIN 80 MG TABLET: 80 | 30 days supply | Qty: 30 | Fill #5

## 2015-04-18 MED FILL — HYDROCODON-APAP 10-325: 10-325 | 30 days supply | Qty: 120 | Fill #0

## 2015-04-18 MED FILL — ZOLPIDEM TARTRATE 10 MG TAB: 10 | 15 days supply | Qty: 15 | Fill #0

## 2015-05-03 MED FILL — METOPROLOL TARTRATE 25 MG T: 25 | 90 days supply | Qty: 180 | Fill #2

## 2015-05-03 MED FILL — LISINOPRIL 5 MG TABLET: 5 | 90 days supply | Qty: 90 | Fill #2

## 2015-05-06 MED FILL — BRILINTA 90 MG TABLET: 90 | 30 days supply | Qty: 60 | Fill #2

## 2015-05-06 MED FILL — traMADol HCL 50 MG TABS: 50 | 30 days supply | Qty: 60 | Fill #0

## 2015-05-14 ENCOUNTER — Telehealth: Payer: Self-pay

## 2015-05-14 NOTE — Telephone Encounter (Signed)
-----   Message from Belva Crome, MD sent at 05/06/2015  6:57 PM EDT ----- Regarding: RE: Patient Assistance Program at [facility] He will be okay for the patient to switch to Plavix 75 mg per day (clopidogrel). He should start with one tablet daily, first dose taken with his last dose of Brilinta. ----- Message -----    From: Leretha Pol    Sent: 05/06/2015   3:19 PM      To: Belva Crome, MD Subject: Patient Assistance Program at [facility] Monday Dr. Tamala Julian,  My name is Tedra Senegal and I am attempting to enroll Mr. Vanblaricom into the PASS program at [facility]. In order for Mr. Daffron to receive Brilinta at no charge from the manufacturer, I need to provide [employer] with a Medicaid denial letter. I have been unable to receive that letter from Mr. Enz. The pharmacist suggests the medication be changed to Plavix. We are a limited resource pharmacy and are out of [place].  Thank you, [facility] PASS Coordinator [address] [place], [place]  [address] [phone] (F)

## 2015-05-14 NOTE — Telephone Encounter (Signed)
Call pt to adv him on Dr.Smith's recommendation below to switch his anti-platelet therapy from Brilinta to Plavix due to cost. lmtcb

## 2015-05-15 NOTE — Telephone Encounter (Signed)
Spoke with pt. Pt sts that he was approved for patient assistance and will be able to get Brilinta free of cost. He will not need to switch medications. No changes made Pt is also due for f/u appt with Dr.Smith. Pt aware appt scheduled for 6/1. Adv pt to call back if we can be of further assistance. Pt voiced appreciation. Update FYI fwd to Dr.Smith.

## 2015-06-04 MED FILL — BRILINTA 90 MG TABLET: 90 | 30 days supply | Qty: 60 | Fill #3

## 2015-06-20 ENCOUNTER — Ambulatory Visit (INDEPENDENT_AMBULATORY_CARE_PROVIDER_SITE_OTHER): Payer: Self-pay | Admitting: Interventional Cardiology

## 2015-06-20 ENCOUNTER — Encounter: Payer: Self-pay | Admitting: Interventional Cardiology

## 2015-06-20 VITALS — BP 128/80 | HR 73 | Ht 70.0 in | Wt 188.0 lb

## 2015-06-20 DIAGNOSIS — I10 Essential (primary) hypertension: Secondary | ICD-10-CM

## 2015-06-20 DIAGNOSIS — G479 Sleep disorder, unspecified: Secondary | ICD-10-CM | POA: Insufficient documentation

## 2015-06-20 DIAGNOSIS — I2510 Atherosclerotic heart disease of native coronary artery without angina pectoris: Secondary | ICD-10-CM

## 2015-06-20 DIAGNOSIS — M25552 Pain in left hip: Secondary | ICD-10-CM | POA: Insufficient documentation

## 2015-06-20 DIAGNOSIS — Z720 Tobacco use: Secondary | ICD-10-CM

## 2015-06-20 DIAGNOSIS — E785 Hyperlipidemia, unspecified: Secondary | ICD-10-CM | POA: Insufficient documentation

## 2015-06-20 LAB — HEPATIC FUNCTION PANEL
ALK PHOS: 64 U/L (ref 40–115)
ALT: 23 U/L (ref 9–46)
AST: 15 U/L (ref 10–40)
Albumin: 4.8 g/dL (ref 3.6–5.1)
BILIRUBIN TOTAL: 0.6 mg/dL (ref 0.2–1.2)
Bilirubin, Direct: 0.1 mg/dL (ref <=0.2)
Indirect Bilirubin: 0.5 mg/dL (ref 0.2–1.2)
TOTAL PROTEIN: 7.4 g/dL (ref 6.1–8.1)

## 2015-06-20 LAB — LIPID PANEL
Cholesterol: 150 mg/dL (ref 125–200)
HDL: 39 mg/dL — AB (ref >=40)
LDL CALC: 78 mg/dL (ref <130)
Total CHOL/HDL Ratio: 3.8 Ratio (ref <=5.0)
Triglycerides: 165 mg/dL — ABNORMAL HIGH (ref <150)
VLDL: 33 mg/dL — AB (ref <30)

## 2015-06-20 MED FILL — tiZANidine HCL 4 MG TABS: 4 | 30 days supply | Qty: 90 | Fill #0

## 2015-06-20 MED FILL — GABAPENTIN 300 MG CAPSULE: 300 | 30 days supply | Qty: 90 | Fill #2

## 2015-06-20 MED FILL — ATORVASTATIN 80 MG TABLET: 80 | 30 days supply | Qty: 30 | Fill #6

## 2015-06-20 NOTE — Patient Instructions (Signed)
Medication Instructions:  Your physician has recommended you make the following change in your medication:  Ok to [place] in September 2017  Labwork: Lipid and Lft today  Testing/Procedures: None ordered  Follow-Up: Your physician wants you to follow-up in: 6 months with Dr.Smith You will receive a reminder letter in the mail two months in advance. If you don't receive a letter, please call our office to schedule the follow-up appointment.   Any Other Special Instructions Will Be Listed Below (If Applicable). STOP SMOKING Your physician discussed the hazards of tobacco use. Tobacco use cessation is recommended and techniques and options to help you quit were discussed.        If you need a refill on your cardiac medications before your next appointment, please call your pharmacy.

## 2015-06-20 NOTE — Progress Notes (Signed)
Cardiology Office Note    Date:  06/20/2015   ID:  Damon Smith, DOB 1965-12-02, MRN [id number]  PCP:  Harvie Junior, MD  Cardiologist: Sinclair Grooms, MD   Chief Complaint  Patient presents with  . Coronary Artery Disease    History of Present Illness:  Damon Smith is a 50 y.o. male f/u CAD with RCA stents, tobacco, depression, and difficulty sleeping.  The patient has multiple complaints, none of which are cardiac. His left hip hurts and prevents him from working. He has no resources to afford his medications. He is concerned that he has been unable to get all Medicare. He continues to smoke cigarettes and feels depressed. He is sleepy all the time and wakes up multiple times each night. He has had no angina or need to use nitroglycerin. He is worried about his heart because he has other.    Past Medical History  Diagnosis Date  . Hyperlipidemia with target LDL less than 70 10/16/2014  . Essential hypertension 10/17/2014  . ST elevation myocardial infarction (STEMI) of inferior wall, initial episode of care ([place]) 10/16/2014  . 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 Presence of DES in RCA : Synergy 4.0 mm x 28 mm (4.5 mm) 10/17/2014  . Tobacco abuse 10/16/2014  . Chronic pain 10/16/2014    On Narcotics @ home.    Past Surgical History  Procedure Laterality Date  . Knee pain    . No past surgeries    . Cardiac catheterization N/A 10/16/2014    Procedure: Left Heart Cath and Coronary Angiography;  Surgeon: Belva Crome, MD;  Location: [facility];  Service: Cardiovascular;  LM ~45%, mRCA 99% (DES PCI), RPL3 70%, mLAD 50% & ostial D1 70%, OM1 75%  . Cardiac catheterization N/A 10/16/2014    Procedure: Coronary Stent Intervention;  Surgeon: Belva Crome, MD;  Location: [facility];  Service: Cardiovascular;  mRCA 99% -->  Synergy DES 4.0 mm x 28 mm (4.5 mm)  . Transthoracic echocardiogram      Current Medications: Outpatient Prescriptions Prior to Visit  Medication Sig Dispense Refill  . aspirin EC 81 MG EC tablet Take 1 tablet (81 mg total) by mouth daily.    Marland Kitchen atorvastatin (LIPITOR) 80 MG tablet Take 1 tablet (80 mg total) by mouth daily at 6 PM. 90 tablet 3  . gabapentin (NEURONTIN) 300 MG capsule Take 300 mg by mouth 3 (three) times daily. Reported on 01/28/2015    . HYDROcodone-acetaminophen (NORCO) 10-325 MG tablet Take 1 tablet by mouth every 6 (six) hours as needed. pain 60 tablet 0  . lisinopril (PRINIVIL,ZESTRIL) 5 MG tablet Take 1 tablet (5 mg total) by mouth daily. 90 tablet 3  . metoprolol tartrate (LOPRESSOR) 25 MG tablet Take 1 tablet (25 mg total) by mouth 2 (two) times daily. 60 tablet 11  . nitroGLYCERIN (NITROSTAT) 0.4 MG SL tablet Place 1 tablet (0.4 mg total) under the tongue every 5 (five) minutes x 3 doses as needed for chest pain. 25 tablet 3  . ticagrelor (BRILINTA) 90 MG TABS tablet Take 1 tablet (90 mg total) by mouth 2 (two) times daily. 180 tablet 3  . tiZANidine (ZANAFLEX) 4 MG tablet Take 4 mg by mouth every 8 (eight) hours as needed for muscle spasms. Reported on 01/28/2015  No facility-administered medications prior to visit.     Allergies:   Review of patient's allergies indicates no known allergies.   Social History   Social History  . Marital Status: Single    Spouse Name: N/A  . Number of Children: N/A  . Years of Education: N/A   Social History Main Topics  . Smoking status: Current Every Day Smoker -- 2.00 packs/day  . Smokeless tobacco: Never Used     Comment: 2 ppd  . Alcohol Use: No  . Drug Use: No  . Sexual Activity: Not Asked   Other Topics Concern  . None   Social History Narrative     Family History:  The patient's family history includes Heart attack (age of onset: 42) in his father; Hyperlipidemia in his father; Hypertension in his father.    ROS:   Please see the history of present illness.    Excessive daytime sleepiness and fatigue. (Dr. York Ram in [place]) . Anxiety, joint swelling, difficulty with balance and walking, excessive fatigue, depression, back pain, muscle pain, and dizziness.  All other systems reviewed and are negative.   PHYSICAL EXAM:   VS:  BP 128/80 mmHg  Pulse 73  Ht 5\' 10"  (1.778 m)  Wt 188 lb (85.276 kg)  BMI 26.98 kg/m2   GEN: Well nourished, well developed, in no acute distress HEENT: normal Neck: no JVD, carotid bruits, or masses Cardiac: RRR; no murmurs, rubs, or gallops,no edema  Respiratory:  clear to auscultation bilaterally, normal work of breathing GI: soft, nontender, nondistended, + BS MS: no deformity or atrophy Skin: warm and dry, no rash Neuro:  Alert and Oriented x 3, Strength and sensation are intact Psych: euthymic mood, full affect  Wt Readings from Last 3 Encounters:  06/20/15 188 lb (85.276 kg)  01/28/15 197 lb 3.2 oz (89.449 kg)  10/23/14 197 lb (89.359 kg)      Studies/Labs Reviewed:   EKG:  EKG  Is not performed today. The last tracing was 10/25/2014 which revealed incomplete right bundle, left axis deviation, and inferior Q-wave infarction.  Current Labs: 10/17/2014: BUN 6; Creatinine, Ser 0.94; Hemoglobin 12.5*; Platelets 230; Potassium 3.6; Sodium 135 12/06/2014: ALT 22   Lipid Panel    Component Value Date/Time   CHOL 99* 12/06/2014 0757   TRIG 135 12/06/2014 0757   HDL 24* 12/06/2014 0757   CHOLHDL 4.1 12/06/2014 0757   VLDL 27 12/06/2014 0757   LDLCALC 48 12/06/2014 0757    Additional studies/ records that were reviewed today include:  Post RCA stent during acute infarction 10/16/14: Post-Intervention Diagram              ASSESSMENT:    1. Coronary artery disease involving native coronary artery of native heart without angina pectoris   2. Essential hypertension   3. Hyperlipidemia   4. Tobacco abuse   5. Sleep disturbance    6. Left hip pain      PLAN:  In order of problems listed above:  1. Asymptomatic, but with destructive habits including continued cigarette smoking. He is advised to stop smoking but is not able to commit to a stop date. We'll discontinue Brilinta in September 6 month follow-up.  2.  Low-salt diet is recommended  3.  Liver and lipid panel will be obtained today.  4. Smoking cessation requested 5. Recommend consideration of sleep study to rule out sleep apnea 6. This was his major complaint today. I discussed the interaction between heavy doses of anti-inflammatory therapy and  acute infarction.   Medication Adjustments/Labs and Tests Ordered: Current medicines are reviewed at length with the patient today.  Concerns regarding medicines are outlined above.  Medication changes, Labs and Tests ordered today are listed in the Patient Instructions below. There are no Patient Instructions on file for this visit.   Signed, Sinclair Grooms, MD  06/20/2015 11:41 AM    [facility] [place], [place], [place]  [address] Phone: [phone]; Fax: [phone]

## 2015-06-26 ENCOUNTER — Other Ambulatory Visit: Payer: Self-pay

## 2015-06-26 DIAGNOSIS — E785 Hyperlipidemia, unspecified: Secondary | ICD-10-CM

## 2015-07-08 MED FILL — BRILINTA 90 MG TABLET: 90 | 30 days supply | Qty: 60 | Fill #4

## 2015-07-11 ENCOUNTER — Emergency Department (HOSPITAL_COMMUNITY)
Admission: EM | Admit: 2015-07-11 | Discharge: 2015-07-12 | Disposition: A | Discharge status: Home / Self Care | Payer: Self-pay | Attending: Emergency Medicine | Admitting: Emergency Medicine

## 2015-07-11 ENCOUNTER — Encounter (HOSPITAL_COMMUNITY): Payer: Self-pay | Admitting: Emergency Medicine

## 2015-07-11 DIAGNOSIS — G8929 Other chronic pain: Secondary | ICD-10-CM | POA: Insufficient documentation

## 2015-07-11 DIAGNOSIS — F17200 Nicotine dependence, unspecified, uncomplicated: Secondary | ICD-10-CM | POA: Insufficient documentation

## 2015-07-11 DIAGNOSIS — F419 Anxiety disorder, unspecified: Secondary | ICD-10-CM | POA: Insufficient documentation

## 2015-07-11 DIAGNOSIS — Z7982 Long term (current) use of aspirin: Secondary | ICD-10-CM | POA: Insufficient documentation

## 2015-07-11 DIAGNOSIS — R300 Dysuria: Secondary | ICD-10-CM | POA: Insufficient documentation

## 2015-07-11 DIAGNOSIS — Z79899 Other long term (current) drug therapy: Secondary | ICD-10-CM | POA: Insufficient documentation

## 2015-07-11 DIAGNOSIS — Z760 Encounter for issue of repeat prescription: Secondary | ICD-10-CM | POA: Insufficient documentation

## 2015-07-11 DIAGNOSIS — I10 Essential (primary) hypertension: Secondary | ICD-10-CM | POA: Insufficient documentation

## 2015-07-11 DIAGNOSIS — I252 Old myocardial infarction: Secondary | ICD-10-CM | POA: Insufficient documentation

## 2015-07-11 DIAGNOSIS — M25552 Pain in left hip: Secondary | ICD-10-CM | POA: Insufficient documentation

## 2015-07-11 DIAGNOSIS — N390 Urinary tract infection, site not specified: Secondary | ICD-10-CM | POA: Insufficient documentation

## 2015-07-11 DIAGNOSIS — I25110 Atherosclerotic heart disease of native coronary artery with unstable angina pectoris: Secondary | ICD-10-CM | POA: Insufficient documentation

## 2015-07-11 LAB — CBC WITH DIFFERENTIAL/PLATELET
Basophils Absolute: 0 10*3/uL (ref 0.0–0.1)
Basophils Relative: 0 %
EOS PCT: 1 %
Eosinophils Absolute: 0.1 10*3/uL (ref 0.0–0.7)
HCT: 43.9 % (ref 39.0–52.0)
HEMOGLOBIN: 14.8 g/dL (ref 13.0–17.0)
LYMPHS ABS: 2.9 10*3/uL (ref 0.7–4.0)
LYMPHS PCT: 27 %
MCH: 30.1 pg (ref 26.0–34.0)
MCHC: 33.7 g/dL (ref 30.0–36.0)
MCV: 89.2 fL (ref 78.0–100.0)
Monocytes Absolute: 0.5 10*3/uL (ref 0.1–1.0)
Monocytes Relative: 5 %
Neutro Abs: 7.4 10*3/uL (ref 1.7–7.7)
Neutrophils Relative %: 67 %
PLATELETS: 290 10*3/uL (ref 150–400)
RBC: 4.92 MIL/uL (ref 4.22–5.81)
RDW: 12.8 % (ref 11.5–15.5)
WBC: 10.9 10*3/uL — AB (ref 4.0–10.5)

## 2015-07-11 LAB — URINALYSIS, ROUTINE W REFLEX MICROSCOPIC
Glucose, UA: 100 mg/dL — AB
KETONES UR: 40 mg/dL — AB
NITRITE: POSITIVE — AB
Protein, ur: >300 mg/dL — AB
Specific Gravity, Urine: 1.028 (ref 1.005–1.030)
pH: 5 (ref 5.0–8.0)

## 2015-07-11 LAB — COMPREHENSIVE METABOLIC PANEL
ALK PHOS: 68 U/L (ref 38–126)
ALT: 27 U/L (ref 17–63)
AST: 21 U/L (ref 15–41)
Albumin: 4.6 g/dL (ref 3.5–5.0)
Anion gap: 9 (ref 5–15)
BILIRUBIN TOTAL: 0.9 mg/dL (ref 0.3–1.2)
BUN: 13 mg/dL (ref 6–20)
CALCIUM: 10 mg/dL (ref 8.9–10.3)
CO2: 23 mmol/L (ref 22–32)
CREATININE: 0.92 mg/dL (ref 0.61–1.24)
Chloride: 106 mmol/L (ref 101–111)
Glucose, Bld: 122 mg/dL — ABNORMAL HIGH (ref 65–99)
Potassium: 3.7 mmol/L (ref 3.5–5.1)
Sodium: 138 mmol/L (ref 135–145)
TOTAL PROTEIN: 7.6 g/dL (ref 6.5–8.1)

## 2015-07-11 LAB — URINE MICROSCOPIC-ADD ON

## 2015-07-11 MED ORDER — HYDROCODONE-ACETAMINOPHEN 5-325 MG PO TABS
2.0000 | ORAL_TABLET | Freq: Once | ORAL | Status: AC
  Administered 2015-07-12: 2 via ORAL
  Filled 2015-07-11: qty 2

## 2015-07-11 MED ORDER — CEPHALEXIN 250 MG PO CAPS
500.0000 mg | ORAL_CAPSULE | Freq: Once | ORAL | Status: AC
  Administered 2015-07-12: 500 mg via ORAL
  Filled 2015-07-11: qty 2

## 2015-07-11 NOTE — ED Notes (Signed)
Pt. presents with multiple complaints : hematuria onset 2 days ago ; chronic left hip pain for 4 years - denies recent injury/ambulatory ; panic and anxiety attack due multiple emotional stressors , withdrawal from [place] and Tramadol with diarrhea / requesting prescription for his pain medications .

## 2015-07-11 NOTE — ED Provider Notes (Signed)
CSN: [id number]     Arrival date & time 07/11/15  1930 History   First MD Initiated Contact with Patient 07/11/15 2324     Chief Complaint  Patient presents with  . Hematuria  . Hip Pain  . Panic Attack  . Anxiety  . Withdrawal  . Medication Refill     (Consider location/radiation/quality/duration/timing/severity/associated sxs/prior Treatment) HPI Comments: Patient presents to the emergency department with chief complaint of dysuria and hematuria. Additionally he also reports chronic left hip pain. States that he is out of his pain medication. States that the primary care office secretary made his appointment for a 35 day interval instead of a 30 day interval. Patient states that he ran out of his pain medication yesterday. He is scheduled to see the primary care doctor on Monday. Denies any fevers chills. Denies any flank pain. Denies any focal abdominal pain.   The history is provided by the patient. No language interpreter was used.    Past Medical History  Diagnosis Date  . Hyperlipidemia with target LDL less than 70 10/16/2014  . Essential hypertension 10/17/2014  . ST elevation myocardial infarction (STEMI) of inferior wall, initial episode of care ([place]) 10/16/2014  . 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 Presence of DES in RCA : Synergy 4.0 mm x 28 mm (4.5 mm) 10/17/2014  . Tobacco abuse 10/16/2014  . Chronic pain 10/16/2014    On Narcotics @ home.   Past Surgical History  Procedure Laterality Date  . Knee pain    . No past surgeries    . Cardiac catheterization N/A 10/16/2014    Procedure: Left Heart Cath and Coronary Angiography;  Surgeon: Belva Crome, MD;  Location: [facility];  Service: Cardiovascular;  LM ~45%, mRCA 99% (DES PCI), RPL3 70%, mLAD 50% & ostial D1 70%, OM1 75%  . Cardiac catheterization N/A 10/16/2014    Procedure:  Coronary Stent Intervention;  Surgeon: Belva Crome, MD;  Location: [facility];  Service: Cardiovascular;  mRCA 99% --> Synergy DES 4.0 mm x 28 mm (4.5 mm)  . Transthoracic echocardiogram     Family History  Problem Relation Age of Onset  . Heart attack Father 1  . Hyperlipidemia Father   . Hypertension Father    Social History  Substance Use Topics  . Smoking status: Current Every Day Smoker -- 2.00 packs/day  . Smokeless tobacco: Never Used     Comment: 2 ppd  . Alcohol Use: No    Review of Systems  Constitutional: Negative for fever and chills.  Respiratory: Negative for shortness of breath.   Cardiovascular: Negative for chest pain.  Gastrointestinal: Negative for nausea, vomiting, diarrhea and constipation.  Genitourinary: Positive for dysuria and hematuria.  Musculoskeletal: Positive for arthralgias.  All other systems reviewed and are negative.     Allergies  Review of patient's allergies indicates no known allergies.  Home Medications   Prior to Admission medications   Medication Sig Start Date End Date Taking? Authorizing Provider  aspirin EC 81 MG EC tablet Take 1 tablet (81 mg total) by mouth daily. 10/18/14   Almyra Deforest, PA  atorvastatin (LIPITOR) 80 MG tablet Take 1 tablet (80 mg total) by mouth daily at 6 PM. 10/18/14   Almyra Deforest, PA  gabapentin (NEURONTIN) 300 MG capsule Take 300 mg by mouth  3 (three) times daily. Reported on 01/28/2015    Historical Provider, MD  HYDROcodone-acetaminophen (NORCO) 10-325 MG tablet Take 1 tablet by mouth every 6 (six) hours as needed. pain 10/18/14   Almyra Deforest, PA  lisinopril (PRINIVIL,ZESTRIL) 5 MG tablet Take 1 tablet (5 mg total) by mouth daily. 10/18/14   Almyra Deforest, PA  metoprolol tartrate (LOPRESSOR) 25 MG tablet Take 1 tablet (25 mg total) by mouth 2 (two) times daily. 10/18/14   Almyra Deforest, PA  nitroGLYCERIN (NITROSTAT) 0.4 MG SL tablet Place 1 tablet (0.4 mg total) under the tongue every 5 (five) minutes x 3 doses as needed  for chest pain. 10/18/14   Almyra Deforest, PA  ticagrelor (BRILINTA) 90 MG TABS tablet Take 1 tablet (90 mg total) by mouth 2 (two) times daily. 01/18/15   Arnoldo Morale, MD  tiZANidine (ZANAFLEX) 4 MG tablet Take 4 mg by mouth every 8 (eight) hours as needed for muscle spasms. Reported on 01/28/2015    Historical Provider, MD  traMADol (ULTRAM) 50 MG tablet Take 50 mg by mouth 2 (two) times daily as needed. (breakthrough pain) 05/06/15   Historical Provider, MD   BP 142/101 mmHg  Pulse 117  Temp(Src) 99 F (37.2 C) (Oral)  Resp 26  Ht 5\' 10"  (1.778 m)  Wt 85.276 kg  BMI 26.98 kg/m2  SpO2 96% Physical Exam  Constitutional: He is oriented to person, place, and time. He appears well-developed and well-nourished.  HENT:  Head: Normocephalic and atraumatic.  Eyes: Conjunctivae and EOM are normal. Pupils are equal, round, and reactive to light. Right eye exhibits no discharge. Left eye exhibits no discharge. No scleral icterus.  Neck: Normal range of motion. Neck supple. No JVD present.  Cardiovascular: Regular rhythm and normal heart sounds.  Exam reveals no gallop and no friction rub.   No murmur heard. tachycardic  Pulmonary/Chest: Effort normal and breath sounds normal. No respiratory distress. He has no wheezes. He has no rales. He exhibits no tenderness.  Abdominal: Soft. He exhibits no distension and no mass. There is no tenderness. There is no rebound and no guarding.  No focal abdominal tenderness, no RLQ tenderness or pain at McBurney's point, no RUQ tenderness or Murphy's sign, no left-sided abdominal tenderness, no fluid wave, or signs of peritonitis   Musculoskeletal: Normal range of motion. He exhibits no edema or tenderness.  Neurological: He is alert and oriented to person, place, and time.  Skin: Skin is warm and dry.  Psychiatric: He has a normal mood and affect. His behavior is normal. Judgment and thought content normal.  Nursing note and vitals reviewed.   ED Course   Procedures (including critical care time) Results for orders placed or performed during the hospital encounter of 07/11/15  CBC with Differential  Result Value Ref Range   WBC 10.9 (H) 4.0 - 10.5 K/uL   RBC 4.92 4.22 - 5.81 MIL/uL   Hemoglobin 14.8 13.0 - 17.0 g/dL   HCT 43.9 39.0 - 52.0 %   MCV 89.2 78.0 - 100.0 fL   MCH 30.1 26.0 - 34.0 pg   MCHC 33.7 30.0 - 36.0 g/dL   RDW 12.8 11.5 - 15.5 %   Platelets 290 150 - 400 K/uL   Neutrophils Relative % 67 %   Neutro Abs 7.4 1.7 - 7.7 K/uL   Lymphocytes Relative 27 %   Lymphs Abs 2.9 0.7 - 4.0 K/uL   Monocytes Relative 5 %   Monocytes Absolute 0.5 0.1 - 1.0 K/uL  Eosinophils Relative 1 %   Eosinophils Absolute 0.1 0.0 - 0.7 K/uL   Basophils Relative 0 %   Basophils Absolute 0.0 0.0 - 0.1 K/uL  Comprehensive metabolic panel  Result Value Ref Range   Sodium 138 135 - 145 mmol/L   Potassium 3.7 3.5 - 5.1 mmol/L   Chloride 106 101 - 111 mmol/L   CO2 23 22 - 32 mmol/L   Glucose, Bld 122 (H) 65 - 99 mg/dL   BUN 13 6 - 20 mg/dL   Creatinine, Ser 0.92 0.61 - 1.24 mg/dL   Calcium 10.0 8.9 - 10.3 mg/dL   Total Protein 7.6 6.5 - 8.1 g/dL   Albumin 4.6 3.5 - 5.0 g/dL   AST 21 15 - 41 U/L   ALT 27 17 - 63 U/L   Alkaline Phosphatase 68 38 - 126 U/L   Total Bilirubin 0.9 0.3 - 1.2 mg/dL   GFR calc non Af Amer >60 >60 mL/min   GFR calc Af Amer >60 >60 mL/min   Anion gap 9 5 - 15  Urinalysis, Routine w reflex microscopic (not at [facility])  Result Value Ref Range   Color, Urine RED (A) YELLOW   APPearance TURBID (A) CLEAR   Specific Gravity, Urine 1.028 1.005 - 1.030   pH 5.0 5.0 - 8.0   Glucose, UA 100 (A) NEGATIVE mg/dL   Hgb urine dipstick LARGE (A) NEGATIVE   Bilirubin Urine LARGE (A) NEGATIVE   Ketones, ur 40 (A) NEGATIVE mg/dL   Protein, ur >300 (A) NEGATIVE mg/dL   Nitrite POSITIVE (A) NEGATIVE   Leukocytes, UA LARGE (A) NEGATIVE  Urine microscopic-add on  Result Value Ref Range   Squamous Epithelial / LPF 0-5 (A) NONE SEEN    WBC, UA TOO NUMEROUS TO COUNT 0 - 5 WBC/hpf   RBC / HPF TOO NUMEROUS TO COUNT 0 - 5 RBC/hpf   Bacteria, UA MANY (A) NONE SEEN   Urine-Other MUCOUS PRESENT    No results found.  I have personally reviewed and evaluated these images and lab results as part of my medical decision-making.   EKG Interpretation None      MDM   Final diagnoses:  UTI (lower urinary tract infection)  Chronic pain    Patient with hematuria and dysuria. Urinalysis is consistent with UTI. Will send urine for culture. Will give first dose antibiotics in the emergency department. Will also give some pain medicine. I do suspect the patient is having some withdrawal from pain medicine as is evident by his tachycardia. I doubt systemic infection or sepsis. Patient is well-appearing and nontoxic in appearance. Plan for discharge to home with primary care follow-up on Monday, patient understands agrees the plan. He is stable and ready for discharge.    Montine Circle, PA-C 07/12/15 0031  Merryl Hacker, MD 07/14/15 2020

## 2015-07-12 MED ORDER — CEPHALEXIN 500 MG PO CAPS: 500.0000 mg | ORAL_CAPSULE | Freq: Four times a day (QID) | ORAL | Status: DC

## 2015-07-12 MED ORDER — TRAMADOL HCL 50 MG PO TABS: 50.0000 mg | ORAL_TABLET | Freq: Four times a day (QID) | ORAL | Status: DC | PRN

## 2015-07-12 MED ORDER — HYDROCODONE-ACETAMINOPHEN 5-325 MG PO TABS
1.0000 | ORAL_TABLET | Freq: Four times a day (QID) | ORAL | Status: DC | PRN
Start: 2015-07-12 — End: 2016-04-02

## 2015-07-12 NOTE — Discharge Instructions (Signed)

## 2015-07-13 LAB — URINE CULTURE: Culture: 2000 — AB

## 2015-07-19 MED FILL — traMADol HCL 50 MG TABS: 50 | 30 days supply | Qty: 60 | Fill #0

## 2015-08-12 MED FILL — BRILINTA 90 MG TABLET: 90 | 30 days supply | Qty: 60 | Fill #5

## 2015-08-13 MED FILL — ATORVASTATIN 80 MG TABLET: 80 | 30 days supply | Qty: 30 | Fill #7

## 2015-08-13 MED FILL — METOPROLOL TARTRATE 25 MG T: 25 | 90 days supply | Qty: 180 | Fill #3

## 2015-10-03 MED FILL — ATORVASTATIN 80 MG TABLET: 80 | 30 days supply | Qty: 30 | Fill #8

## 2015-11-11 ENCOUNTER — Other Ambulatory Visit: Payer: Self-pay | Admitting: *Deleted

## 2015-11-11 MED ORDER — ATORVASTATIN CALCIUM 80 MG PO TABS: 80.0000 mg | ORAL_TABLET | Freq: Every day | ORAL | 2 refills | Status: DC

## 2015-11-20 NOTE — Progress Notes (Signed)
Cardiology Office Note   Date:  11/21/2015   ID:  Damon Smith, DOB Sep 17, 1965, MRN [id number]  PCP:  Harvie Junior, MD  Cardiologist:  Dr. Tamala Julian    Chief Complaint  Patient presents with  . Chest Pain    pt states he sometimes feels wierd things in his chest area, also states he has some numbness in his left arm   . Shortness of Breath    some       History of Present Illness: Damon Smith is a 50 y.o. male who presents for CAD with RCA stents, tobacco, depression, and difficulty sleeping.  Pt has no insurance and has been unable to do sleep study or have hip surgery.  He is frustrated with his condition and inability to work -he has applied for disability but has not been able to have.  We discussed possible vocational rehab to help him have the means to have surgery so once recovered to go back to his usual job.  He stopped his Brillinta in August the company did not send him anymore.  He continues ASA.  He continues to smoke > 1 ppd.  We discussed importance of stopping.  Today he complains of his anxiety, recent UTI, and depression.  Also his fear of dying from heart disease.  He has Lt arm numbness but no chest pressure like he has had with his MI.  He continues with Lt hip pain.  He is able to work when he does without chest pain but his arm bothers him.  He does walk for exercise.  Has not needed NTG.     Past Medical History:  Diagnosis Date  . Chronic pain 10/16/2014   On Narcotics @ home.  . 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 Essential hypertension 10/17/2014  . Hyperlipidemia with target LDL less than 70 10/16/2014  . Presence of DES in RCA : Synergy 4.0 mm x 28 mm (4.5 mm) 10/17/2014  . ST elevation myocardial infarction (STEMI) of inferior wall, initial episode of care ([place]) 10/16/2014  . Tobacco abuse 10/16/2014     Past Surgical History:  Procedure Laterality Date  . CARDIAC CATHETERIZATION N/A 10/16/2014   Procedure: Left Heart Cath and Coronary Angiography;  Surgeon: Belva Crome, MD;  Location: [facility];  Service: Cardiovascular;  LM ~45%, mRCA 99% (DES PCI), RPL3 70%, mLAD 50% & ostial D1 70%, OM1 75%  . CARDIAC CATHETERIZATION N/A 10/16/2014   Procedure: Coronary Stent Intervention;  Surgeon: Belva Crome, MD;  Location: [facility];  Service: Cardiovascular;  mRCA 99% --> Synergy DES 4.0 mm x 28 mm (4.5 mm)  . knee pain    . NO PAST SURGERIES    . TRANSTHORACIC ECHOCARDIOGRAM       Current Outpatient Prescriptions  Medication Sig Dispense Refill  . aspirin EC 81 MG EC tablet Take 1 tablet (81 mg total) by mouth daily.    Marland Kitchen atorvastatin (LIPITOR) 80 MG tablet Take 1 tablet (80 mg total) by mouth daily at 6 PM. 90 tablet 2  . gabapentin (NEURONTIN) 300 MG capsule Take 300 mg by mouth 3 (three) times daily. Reported on 01/28/2015    . HYDROcodone-acetaminophen (NORCO) 10-325 MG tablet Take 1 tablet by mouth as directed.    Marland Kitchen HYDROcodone-acetaminophen (NORCO/VICODIN) 5-325 MG tablet Take 1-2  tablets by mouth every 6 (six) hours as needed. 10 tablet 0  . lisinopril (PRINIVIL,ZESTRIL) 5 MG tablet Take 1 tablet (5 mg total) by mouth daily. 90 tablet 3  . metoprolol tartrate (LOPRESSOR) 25 MG tablet Take 1.5 tablets (37.5 mg total) by mouth 2 (two) times daily. 270 tablet 11  . nitroGLYCERIN (NITROSTAT) 0.4 MG SL tablet Place 1 tablet (0.4 mg total) under the tongue every 5 (five) minutes x 3 doses as needed for chest pain. 25 tablet 3  . tiZANidine (ZANAFLEX) 4 MG tablet Take 4 mg by mouth every 8 (eight) hours as needed for muscle spasms. Reported on 01/28/2015    . traMADol (ULTRAM) 50 MG tablet Take 1 tablet (50 mg total) by mouth every 6 (six) hours as needed. 10 tablet 0  . clopidogrel (PLAVIX) 75 MG tablet Take 1 tablet (75 mg total) by mouth daily. 90 tablet 3   No current  facility-administered medications for this visit.     Allergies:   Review of patient's allergies indicates no known allergies.    Social History:  The patient  reports that he has been smoking.  He has been smoking about 2.00 packs per day. He has never used smokeless tobacco. He reports that he does not drink alcohol or use drugs.   Family History:  The patient's family history includes Heart attack (age of onset: 67) in his father; Hyperlipidemia in his father; Hypertension in his father.    ROS:  General:no colds or fevers, + weight gain Skin:no rashes or ulcers HEENT:no blurred vision, no congestion CV:see HPI PUL:see HPI GI:no diarrhea constipation or melena, no indigestion GU:no hematuria, no dysuria, but recent UTI and Rt groin pain MS:++ lt hip pain, no claudication symptoms, + Lt arm numbness Neuro:no syncope, no lightheadedness Endo:no diabetes, no thyroid disease  Wt Readings from Last 3 Encounters:  11/21/15 198 lb 12.8 oz (90.2 kg)  07/11/15 188 lb (85.3 kg)  06/20/15 188 lb (85.3 kg)     PHYSICAL EXAM: VS:  BP 132/90   Pulse 70   Ht 5\' 10"  (1.778 m)   Wt 198 lb 12.8 oz (90.2 kg)   BMI 28.52 kg/m  , BMI Body mass index is 28.52 kg/m. General:Pleasant affect, NAD, anxious Skin:Warm and dry, brisk capillary refill HEENT:normocephalic, sclera clear, mucus membranes moist Neck:supple, no JVD, no bruits  Heart:S1S2 RRR without murmur, gallup, rub or click Lungs:clear without rales, rhonchi, or wheezes [id number], non tender, + BS, do not palpate liver spleen or masses Ext:no lower ext edema, 2+ pedal pulses, 2+ radial pulses, Lt arm numbness increases with holding arm up. No pain with palpation along spine. Neuro:alert and oriented X 3, MAE, follows commands, + facial symmetry    EKG:  EKG is ordered today. The ekg ordered today demonstrates SR LAD and incomplete Rt BBB.    Recent Labs: 07/11/2015: ALT 27; BUN 13; Creatinine, Ser 0.92; Hemoglobin 14.8;  Platelets 290; Potassium 3.7; Sodium 138    Lipid Panel    Component Value Date/Time   CHOL 150 06/20/2015 1154   TRIG 165 (H) 06/20/2015 1154   HDL 39 (L) 06/20/2015 1154   CHOLHDL 3.8 06/20/2015 1154   VLDL 33 (H) 06/20/2015 1154   LDLCALC 78 06/20/2015 1154       Other studies Reviewed: Additional studies/ records that were reviewed today include: . Cardiac cath Study Conclusion   1. 1st RPLB lesion, 70% stenosed. 2. 1st Mrg lesion, 75% stenosed. 3. Mid LAD to Dist LAD  lesion, 50% stenosed. 4. Ost 1st Diag lesion, 70% stenosed. 5. 1st Diag lesion, 35% stenosed. 6. LM lesion, 45% stenosed. 7. Prox RCA to Dist RCA lesion, 99% stenosed. There is a 0% residual stenosis post intervention. 8. A drug-eluting stent was placed.    Acute inferior wall ST elevation myocardial infarction due to high-grade mid right coronary obstruction with likely distal embolization to the microcirculation.  Successful PTCA and stenting of the mid right coronary from 99% to 0% with TIMI grade 3 flow. Residual 50-70% left ventricular branch obstruction is noted.  Moderate distal left main, moderate first diagonal stenosis, mild-to-moderate diffuse mid LAD, and moderately severe first obtuse marginal  Low normal left ventricular function with inferoapical hypokinesis. EF 45-50%.   RECOMMENDATIONS:   Aggressive risk factor modification including lipid management and smoking cessation.  Care management assistance with medication needs.  Phase I into cardiac rehabilitation  Discharge 48-72 hours.  Patient is high risk for medication noncompliance.    ECHO: 09/2014 Study Conclusions  - Left ventricle: The cavity size was normal. Systolic function was   normal. The estimated ejection fraction was in the range of 55%   to 60%. Hypokinesis of the basal-midinferior myocardium. Left   ventricular diastolic function parameters were normal. - Aortic valve: There was trivial  regurgitation. - Mitral valve: There was mild regurgitation. - Pulmonary arteries: PA peak pressure: 33 mm Hg (S).  ASSESSMENT AND PLAN:  1.  CAD with hx MI and Stent to RCA, pt states he felt better on Brillinta but was stopped in August.  Will add Plavix 75 mg daily.  2.  HTN essential BP elevated today will increase lopressor to 37.5 BID.   3. Hyperlipidemia - labs checked in the spring  Recheck in future, continue statin   4. Lt hip pain, see PCP  5. Lt arm Numbness most likely due to DDD, if symptoms increase or chest pain develops may need cardiac cath.  6. Depression/Anxiety encouraged him to see counselor in [facility]- his friend goes there as well.  He does not want meds but just talking was encouraged.    follow up with Dr. Tamala Julian in Jan.  7.  lt groin to se PCP for follow up.     Current medicines are reviewed with the patient today.  The patient Has no concerns regarding medicines.  The following changes have been made:  See above Labs/ tests ordered today include:see above  Disposition:   FU:  see above  Signed, Cecilie Kicks, NP  11/21/2015 5:26 PM    [address], [place], [address] [place], [place] Phone: [phone]; Fax: [phone]

## 2015-11-21 ENCOUNTER — Ambulatory Visit (INDEPENDENT_AMBULATORY_CARE_PROVIDER_SITE_OTHER): Payer: Self-pay | Admitting: Cardiology

## 2015-11-21 ENCOUNTER — Encounter (INDEPENDENT_AMBULATORY_CARE_PROVIDER_SITE_OTHER): Payer: Self-pay

## 2015-11-21 ENCOUNTER — Encounter: Payer: Self-pay | Admitting: Cardiology

## 2015-11-21 VITALS — BP 132/90 | HR 70 | Ht 70.0 in | Wt 198.8 lb

## 2015-11-21 DIAGNOSIS — F419 Anxiety disorder, unspecified: Secondary | ICD-10-CM

## 2015-11-21 DIAGNOSIS — I10 Essential (primary) hypertension: Secondary | ICD-10-CM

## 2015-11-21 DIAGNOSIS — M25552 Pain in left hip: Secondary | ICD-10-CM

## 2015-11-21 DIAGNOSIS — G479 Sleep disorder, unspecified: Secondary | ICD-10-CM

## 2015-11-21 DIAGNOSIS — E782 Mixed hyperlipidemia: Secondary | ICD-10-CM

## 2015-11-21 DIAGNOSIS — I2510 Atherosclerotic heart disease of native coronary artery without angina pectoris: Secondary | ICD-10-CM

## 2015-11-21 DIAGNOSIS — Z720 Tobacco use: Secondary | ICD-10-CM

## 2015-11-21 MED ORDER — CLOPIDOGREL BISULFATE 75 MG PO TABS: 75.0000 mg | ORAL_TABLET | Freq: Every day | ORAL | 3 refills | Status: DC

## 2015-11-21 MED ORDER — METOPROLOL TARTRATE 25 MG PO TABS: 37.5000 mg | ORAL_TABLET | Freq: Two times a day (BID) | ORAL | 11 refills | Status: DC

## 2015-11-21 NOTE — Patient Instructions (Addendum)
Medication Instructions:  Your physician has recommended you make the following change in your medication:  1.  INCREASE the Metoprolol to 25 mg taking 1 1/2 tablets twice a day 2. START Plavix 75 mg taking 1 tablet daily   Labwork: None ordered  Testing/Procedures: None ordered  Follow-Up: Your physician recommends that you schedule a follow-up appointment in: Keep your appointment with Dr. Tamala Smith    Any Other Special Instructions Will Be Listed Below (If Applicable).    Steps to Quit Smoking  Smoking tobacco can be harmful to your health and can affect almost every organ in your body. Smoking puts you, and those around you, at risk for developing many serious chronic diseases. Quitting smoking is difficult, but it is one of the best things that you can do for your health. It is never too late to quit. WHAT ARE THE BENEFITS OF QUITTING SMOKING? When you quit smoking, you lower your risk of developing serious diseases and conditions, such as:  Lung cancer or lung disease, such as COPD.  Heart disease.  Stroke.  Heart attack.  Infertility.  Osteoporosis and bone fractures. Additionally, symptoms such as coughing, wheezing, and shortness of breath may get better when you quit. You may also find that you get sick less often because your body is stronger at fighting off colds and infections. If you are pregnant, quitting smoking can help to reduce your chances of having a baby of low birth weight. HOW DO I GET READY TO QUIT? When you decide to quit smoking, create a plan to make sure that you are successful. Before you quit:  Pick a date to quit. Set a date within the next two weeks to give you time to prepare.  Write down the reasons why you are quitting. Keep this list in places where you will see it often, such as on your bathroom mirror or in your car or wallet.  Identify the people, places, things, and activities that make you want to smoke (triggers) and avoid them. Make  sure to take these actions:  Throw away all cigarettes at home, at work, and in your car.  Throw away smoking accessories, such as Scientist, research (medical).  Clean your car and make sure to empty the ashtray.  Clean your home, including curtains and carpets.  Tell your family, friends, and coworkers that you are quitting. Support from your loved ones can make quitting easier.  Talk with your health care provider about your options for quitting smoking.  Find out what treatment options are covered by your health insurance. WHAT STRATEGIES CAN I USE TO QUIT SMOKING?  Talk with your healthcare provider about different strategies to quit smoking. Some strategies include:  Quitting smoking altogether instead of gradually lessening how much you smoke over a period of time. Research shows that quitting "cold [place]" is more successful than gradually quitting.  Attending in-person counseling to help you build problem-solving skills. You are more likely to have success in quitting if you attend several counseling sessions. Even short sessions of 10 minutes can be effective.  Finding resources and support systems that can help you to quit smoking and remain smoke-free after you quit. These resources are most helpful when you use them often. They can include:  Online chats with a Social worker.  Telephone quitlines.  Printed Furniture conservator/restorer.  Support groups or group counseling.  Text messaging programs.  Mobile phone applications.  Taking medicines to help you quit smoking. (If you are pregnant or breastfeeding, talk  with your health care provider first.) Some medicines contain nicotine and some do not. Both types of medicines help with cravings, but the medicines that include nicotine help to relieve withdrawal symptoms. Your health care provider may recommend:  Nicotine patches, gum, or lozenges.  Nicotine inhalers or sprays.  Non-nicotine medicine that is taken by mouth. Talk with  your health care provider about combining strategies, such as taking medicines while you are also receiving in-person counseling. Using these two strategies together makes you more likely to succeed in quitting than if you used either strategy on its own. If you are pregnant or breastfeeding, talk with your health care provider about finding counseling or other support strategies to quit smoking. Do not take medicine to help you quit smoking unless told to do so by your health care provider. WHAT THINGS CAN I DO TO MAKE IT EASIER TO QUIT? Quitting smoking might feel overwhelming at first, but there is a lot that you can do to make it easier. Take these important actions:  Reach out to your family and friends and ask that they support and encourage you during this time. Call telephone quitlines, reach out to support groups, or work with a counselor for support.  Ask people who smoke to avoid smoking around you.  Avoid places that trigger you to smoke, such as bars, parties, or smoke-break areas at work.  Spend time around people who do not smoke.  Lessen stress in your life, because stress can be a smoking trigger for some people. To lessen stress, try:  Exercising regularly.  Deep-breathing exercises.  Yoga.  Meditating.  Performing a body scan. This involves closing your eyes, scanning your body from head to toe, and noticing which parts of your body are particularly tense. Purposefully relax the muscles in those areas.  Download or purchase mobile phone or tablet apps (applications) that can help you stick to your quit plan by providing reminders, tips, and encouragement. There are many free apps, such as QuitGuide from the [employer] Office manager for Disease Control and Prevention). You can find other support for quitting smoking (smoking cessation) through smokefree.gov and other websites. HOW WILL I FEEL WHEN I QUIT SMOKING? Within the first 24 hours of quitting smoking, you may start to feel  some withdrawal symptoms. These symptoms are usually most noticeable 2-3 days after quitting, but they usually do not last beyond 2-3 weeks. Changes or symptoms that you might experience include:  Mood swings.  Restlessness, anxiety, or irritation.  Difficulty concentrating.  Dizziness.  Strong cravings for sugary foods in addition to nicotine.  Mild weight gain.  Constipation.  Nausea.  Coughing or a sore throat.  Changes in how your medicines work in your body.  A depressed mood.  Difficulty sleeping (insomnia). After the first 2-3 weeks of quitting, you may start to notice more positive results, such as:  Improved sense of smell and taste.  Decreased coughing and sore throat.  Slower heart rate.  Lower blood pressure.  Clearer skin.  The ability to breathe more easily.  Fewer sick days. Quitting smoking is very challenging for most people. Do not get discouraged if you are not successful the first time. Some people need to make many attempts to quit before they achieve long-term success. Do your best to stick to your quit plan, and talk with your health care provider if you have any questions or concerns.   This information is not intended to replace advice given to you by your health care  provider. Make sure you discuss any questions you have with your health care provider.   Document Released: 12/30/2000 Document Revised: 05/22/2014 Document Reviewed: 05/22/2014 Elsevier Interactive Patient Education [employer].   If you need a refill on your cardiac medications before your next appointment, please call your pharmacy.

## 2016-01-20 HISTORY — PX: NEPHRECTOMY: SHX65

## 2016-02-12 ENCOUNTER — Ambulatory Visit: Payer: Self-pay | Admitting: Interventional Cardiology

## 2016-02-27 ENCOUNTER — Encounter: Payer: Self-pay | Admitting: Interventional Cardiology

## 2016-03-31 ENCOUNTER — Inpatient Hospital Stay (HOSPITAL_COMMUNITY): Payer: Self-pay

## 2016-03-31 ENCOUNTER — Emergency Department (HOSPITAL_COMMUNITY): Payer: Self-pay

## 2016-03-31 ENCOUNTER — Inpatient Hospital Stay (HOSPITAL_COMMUNITY)
Admission: EM | Admit: 2016-03-31 | Discharge: 2016-04-02 | DRG: 687 | Disposition: A | Discharge status: Home / Self Care | Payer: Self-pay | Attending: Internal Medicine | Admitting: Internal Medicine

## 2016-03-31 ENCOUNTER — Encounter (HOSPITAL_COMMUNITY): Payer: Self-pay | Admitting: Emergency Medicine

## 2016-03-31 DIAGNOSIS — F17210 Nicotine dependence, cigarettes, uncomplicated: Secondary | ICD-10-CM | POA: Diagnosis present

## 2016-03-31 DIAGNOSIS — D49519 Neoplasm of unspecified behavior of unspecified kidney: Secondary | ICD-10-CM

## 2016-03-31 DIAGNOSIS — F329 Major depressive disorder, single episode, unspecified: Secondary | ICD-10-CM | POA: Diagnosis present

## 2016-03-31 DIAGNOSIS — I700 Atherosclerosis of aorta: Secondary | ICD-10-CM | POA: Diagnosis present

## 2016-03-31 DIAGNOSIS — R52 Pain, unspecified: Secondary | ICD-10-CM

## 2016-03-31 DIAGNOSIS — I10 Essential (primary) hypertension: Secondary | ICD-10-CM | POA: Diagnosis present

## 2016-03-31 DIAGNOSIS — K2970 Gastritis, unspecified, without bleeding: Secondary | ICD-10-CM | POA: Diagnosis present

## 2016-03-31 DIAGNOSIS — Z8249 Family history of ischemic heart disease and other diseases of the circulatory system: Secondary | ICD-10-CM

## 2016-03-31 DIAGNOSIS — C642 Malignant neoplasm of left kidney, except renal pelvis: Principal | ICD-10-CM | POA: Diagnosis present

## 2016-03-31 DIAGNOSIS — F419 Anxiety disorder, unspecified: Secondary | ICD-10-CM | POA: Diagnosis present

## 2016-03-31 DIAGNOSIS — Z79899 Other long term (current) drug therapy: Secondary | ICD-10-CM

## 2016-03-31 DIAGNOSIS — N1330 Unspecified hydronephrosis: Secondary | ICD-10-CM

## 2016-03-31 DIAGNOSIS — Z7982 Long term (current) use of aspirin: Secondary | ICD-10-CM

## 2016-03-31 DIAGNOSIS — G8929 Other chronic pain: Secondary | ICD-10-CM | POA: Diagnosis present

## 2016-03-31 DIAGNOSIS — R319 Hematuria, unspecified: Secondary | ICD-10-CM

## 2016-03-31 DIAGNOSIS — Z7902 Long term (current) use of antithrombotics/antiplatelets: Secondary | ICD-10-CM

## 2016-03-31 DIAGNOSIS — N2889 Other specified disorders of kidney and ureter: Secondary | ICD-10-CM | POA: Diagnosis present

## 2016-03-31 DIAGNOSIS — K449 Diaphragmatic hernia without obstruction or gangrene: Secondary | ICD-10-CM | POA: Diagnosis present

## 2016-03-31 DIAGNOSIS — N1339 Other hydronephrosis: Secondary | ICD-10-CM | POA: Diagnosis present

## 2016-03-31 DIAGNOSIS — I2510 Atherosclerotic heart disease of native coronary artery without angina pectoris: Secondary | ICD-10-CM | POA: Diagnosis present

## 2016-03-31 DIAGNOSIS — M1612 Unilateral primary osteoarthritis, left hip: Secondary | ICD-10-CM | POA: Diagnosis present

## 2016-03-31 DIAGNOSIS — Z720 Tobacco use: Secondary | ICD-10-CM

## 2016-03-31 DIAGNOSIS — R310 Gross hematuria: Secondary | ICD-10-CM | POA: Diagnosis present

## 2016-03-31 DIAGNOSIS — I252 Old myocardial infarction: Secondary | ICD-10-CM

## 2016-03-31 DIAGNOSIS — Z955 Presence of coronary angioplasty implant and graft: Secondary | ICD-10-CM

## 2016-03-31 DIAGNOSIS — R000 Tachycardia, unspecified: Secondary | ICD-10-CM | POA: Diagnosis present

## 2016-03-31 DIAGNOSIS — E785 Hyperlipidemia, unspecified: Secondary | ICD-10-CM | POA: Diagnosis present

## 2016-03-31 DIAGNOSIS — N179 Acute kidney failure, unspecified: Secondary | ICD-10-CM | POA: Diagnosis present

## 2016-03-31 LAB — PROTIME-INR
INR: 0.95
Prothrombin Time: 12.6 seconds (ref 11.4–15.2)

## 2016-03-31 LAB — URINALYSIS, ROUTINE W REFLEX MICROSCOPIC
Bilirubin Urine: NEGATIVE
Glucose, UA: NEGATIVE mg/dL
Ketones, ur: 5 mg/dL — AB
Leukocytes, UA: NEGATIVE
Nitrite: NEGATIVE
PROTEIN: NEGATIVE mg/dL
Specific Gravity, Urine: 1.02 (ref 1.005–1.030)
pH: 5 (ref 5.0–8.0)

## 2016-03-31 LAB — CBC
HEMATOCRIT: 43.7 % (ref 39.0–52.0)
Hemoglobin: 15.3 g/dL (ref 13.0–17.0)
MCH: 30.8 pg (ref 26.0–34.0)
MCHC: 35 g/dL (ref 30.0–36.0)
MCV: 88.1 fL (ref 78.0–100.0)
Platelets: 254 10*3/uL (ref 150–400)
RBC: 4.96 MIL/uL (ref 4.22–5.81)
RDW: 12.5 % (ref 11.5–15.5)
WBC: 20.4 10*3/uL — AB (ref 4.0–10.5)

## 2016-03-31 LAB — COMPREHENSIVE METABOLIC PANEL
ALT: 29 U/L (ref 17–63)
AST: 24 U/L (ref 15–41)
Albumin: 5 g/dL (ref 3.5–5.0)
Alkaline Phosphatase: 68 U/L (ref 38–126)
Anion gap: 10 (ref 5–15)
BUN: 17 mg/dL (ref 6–20)
CHLORIDE: 108 mmol/L (ref 101–111)
CO2: 20 mmol/L — ABNORMAL LOW (ref 22–32)
Calcium: 9.6 mg/dL (ref 8.9–10.3)
Creatinine, Ser: 1.13 mg/dL (ref 0.61–1.24)
GFR calc Af Amer: >60 mL/min (ref >60)
Glucose, Bld: 129 mg/dL — ABNORMAL HIGH (ref 65–99)
POTASSIUM: 4 mmol/L (ref 3.5–5.1)
Sodium: 138 mmol/L (ref 135–145)
Total Bilirubin: 1.1 mg/dL (ref 0.3–1.2)
Total Protein: 8.2 g/dL — ABNORMAL HIGH (ref 6.5–8.1)

## 2016-03-31 LAB — LIPASE, BLOOD: LIPASE: 20 U/L (ref 11–51)

## 2016-03-31 MED ORDER — OXYCODONE HCL 5 MG PO TABS
10.0000 mg | ORAL_TABLET | Freq: Four times a day (QID) | ORAL | Status: DC
  Administered 2016-03-31 (×2): 10 mg via ORAL
  Filled 2016-03-31 (×2): qty 2

## 2016-03-31 MED ORDER — HYDROMORPHONE HCL 1 MG/ML IJ SOLN
1.0000 mg | INTRAMUSCULAR | Status: DC | PRN
  Administered 2016-03-31 – 2016-04-01 (×5): 1 mg via INTRAVENOUS
  Filled 2016-03-31 (×5): qty 1

## 2016-03-31 MED ORDER — ONDANSETRON HCL 4 MG/2ML IJ SOLN
4.0000 mg | Freq: Four times a day (QID) | INTRAMUSCULAR | Status: DC | PRN
  Administered 2016-04-01: 4 mg via INTRAVENOUS
  Filled 2016-03-31: qty 2

## 2016-03-31 MED ORDER — ACETAMINOPHEN 650 MG RE SUPP: 650.0000 mg | Freq: Four times a day (QID) | RECTAL | Status: DC | PRN

## 2016-03-31 MED ORDER — ATORVASTATIN CALCIUM 80 MG PO TABS
80.0000 mg | ORAL_TABLET | Freq: Every day | ORAL | Status: DC
  Administered 2016-04-01: 80 mg via ORAL
  Filled 2016-03-31: qty 2
  Filled 2016-03-31: qty 1

## 2016-03-31 MED ORDER — METOPROLOL TARTRATE 25 MG PO TABS
37.5000 mg | ORAL_TABLET | Freq: Two times a day (BID) | ORAL | Status: DC
  Administered 2016-03-31 – 2016-04-02 (×4): 37.5 mg via ORAL
  Filled 2016-03-31 (×4): qty 2

## 2016-03-31 MED ORDER — ONDANSETRON HCL 4 MG PO TABS: 4.0000 mg | ORAL_TABLET | Freq: Four times a day (QID) | ORAL | Status: DC | PRN

## 2016-03-31 MED ORDER — IOPAMIDOL (ISOVUE-300) INJECTION 61%
INTRAVENOUS | Status: AC
  Filled 2016-03-31: qty 30

## 2016-03-31 MED ORDER — DULOXETINE HCL 30 MG PO CPEP
30.0000 mg | ORAL_CAPSULE | Freq: Every day | ORAL | Status: DC
  Administered 2016-03-31 – 2016-04-02 (×3): 30 mg via ORAL
  Filled 2016-03-31 (×3): qty 1

## 2016-03-31 MED ORDER — IOPAMIDOL (ISOVUE-300) INJECTION 61%: 100.0000 mL | Freq: Once | INTRAVENOUS | Status: DC | PRN

## 2016-03-31 MED ORDER — IOPAMIDOL (ISOVUE-300) INJECTION 61%
INTRAVENOUS | Status: AC
  Filled 2016-03-31: qty 100

## 2016-03-31 MED ORDER — IOPAMIDOL (ISOVUE-300) INJECTION 61%
30.0000 mL | Freq: Once | INTRAVENOUS | Status: AC | PRN
  Administered 2016-03-31: 30 mL via ORAL

## 2016-03-31 MED ORDER — ATORVASTATIN CALCIUM 40 MG PO TABS
80.0000 mg | ORAL_TABLET | Freq: Once | ORAL | Status: AC
  Administered 2016-03-31: 80 mg via ORAL
  Filled 2016-03-31: qty 2

## 2016-03-31 MED ORDER — KETOROLAC TROMETHAMINE 30 MG/ML IJ SOLN
30.0000 mg | Freq: Once | INTRAMUSCULAR | Status: AC
  Administered 2016-03-31: 30 mg via INTRAVENOUS
  Filled 2016-03-31: qty 1

## 2016-03-31 MED ORDER — TAMSULOSIN HCL 0.4 MG PO CAPS
0.4000 mg | ORAL_CAPSULE | Freq: Every day | ORAL | Status: DC
  Administered 2016-03-31 – 2016-04-02 (×3): 0.4 mg via ORAL
  Filled 2016-03-31 (×3): qty 1

## 2016-03-31 MED ORDER — SODIUM CHLORIDE 0.9 % IV SOLN
INTRAVENOUS | Status: DC
  Administered 2016-03-31 – 2016-04-02 (×4): via INTRAVENOUS

## 2016-03-31 MED ORDER — ONDANSETRON HCL 4 MG/2ML IJ SOLN
4.0000 mg | Freq: Once | INTRAMUSCULAR | Status: AC
  Administered 2016-03-31: 4 mg via INTRAVENOUS
  Filled 2016-03-31: qty 2

## 2016-03-31 MED ORDER — HYDROMORPHONE HCL 1 MG/ML IJ SOLN
2.0000 mg | Freq: Once | INTRAMUSCULAR | Status: AC
  Administered 2016-03-31: 2 mg via INTRAVENOUS
  Filled 2016-03-31: qty 2

## 2016-03-31 MED ORDER — ACETAMINOPHEN 325 MG PO TABS: 650.0000 mg | ORAL_TABLET | Freq: Four times a day (QID) | ORAL | Status: DC | PRN

## 2016-03-31 MED ORDER — GABAPENTIN 300 MG PO CAPS
300.0000 mg | ORAL_CAPSULE | Freq: Every day | ORAL | Status: DC
  Administered 2016-03-31 – 2016-04-01 (×2): 300 mg via ORAL
  Filled 2016-03-31 (×2): qty 1

## 2016-03-31 MED ORDER — OXYCODONE HCL 5 MG PO TABS: 5.0000 mg | ORAL_TABLET | ORAL | Status: DC | PRN

## 2016-03-31 MED ORDER — IOPAMIDOL (ISOVUE-300) INJECTION 61%
100.0000 mL | Freq: Once | INTRAVENOUS | Status: AC | PRN
  Administered 2016-03-31: 100 mL via INTRAVENOUS

## 2016-03-31 MED ORDER — HYDROMORPHONE HCL 1 MG/ML IJ SOLN
1.0000 mg | Freq: Once | INTRAMUSCULAR | Status: AC
  Administered 2016-03-31: 1 mg via INTRAVENOUS
  Filled 2016-03-31: qty 1

## 2016-03-31 MED ORDER — SODIUM CHLORIDE 0.9 % IV SOLN
1000.0000 mL | INTRAVENOUS | Status: DC
  Administered 2016-03-31: 1000 mL via INTRAVENOUS

## 2016-03-31 MED ORDER — SODIUM CHLORIDE 0.9 % IV SOLN
1000.0000 mL | Freq: Once | INTRAVENOUS | Status: AC
  Administered 2016-03-31: 1000 mL via INTRAVENOUS

## 2016-03-31 NOTE — H&P (Signed)
History and Physical    Damon Smith BJY:[id number] DOB: April 05, 1965 DOA: 03/31/2016    PCP: Harvie Junior, MD  Patient coming from: home  Chief Complaint: left lower abdominal pain  HPI: [name] is a 51 y.o. male with medical history significant of MI s/p DES to RCA in 2016, heavy smoker with anxiety and depression due to [place] of his girlfriend to cancer in 12/17. He presents with left lower abdominal pain starting this AM. He thought it was a bowel obstruction initially. Pain started to radiate to his left flank and he vomited twice and called EMS. On the way to the ER his pain became severe and he required multiple doses of Dilaudid for releif. Currently is not in pain. States urine has been dark brown today. He was seen about 1 mo ago for hematuria and told he had a UTI which he took antibiotics. He has not had any other issues.   ED Course: CT renal stone study: There is a heterogenous mass within the lower pole of the left kidney which contains internal calcifications and hyperdensity. This measures approximately 8.7 by 6.5 by 7.9 cm. There is hyperdense material extending into the renal collecting system. There is moderate hydronephrosis with high density material present in the proximal left ureter  Mild tachycardia, BP low normal WBC 20 UA shows hematuria with 6-30 WBC and rare bacteria  Review of Systems:  - Depression since losing his girlfriend to cancer on 12/23/16. Not suicidal. Uses Neurontin for panic attacks - these have been improving since being started on Cymbalta and now using Neurontin less - chronic left hip pain on Hydrocodone 4 x day - no chest pain  - mild weight loss since Dec - mild cough from smoking  All other systems reviewed and apart from HPI, are negative.  Past Medical History:  Diagnosis Date  . Chronic pain 10/16/2014   On Narcotics @ home.  . 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 Essential hypertension 10/17/2014  . Hyperlipidemia with target LDL less than 70 10/16/2014  . Presence of DES in RCA : Synergy 4.0 mm x 28 mm (4.5 mm) 10/17/2014  . ST elevation myocardial infarction (STEMI) of inferior wall, initial episode of care ([place]) 10/16/2014  . Tobacco abuse 10/16/2014    Past Surgical History:  Procedure Laterality Date  . CARDIAC CATHETERIZATION N/A 10/16/2014   Procedure: Left Heart Cath and Coronary Angiography;  Surgeon: Belva Crome, MD;  Location: [facility];  Service: Cardiovascular;  LM ~45%, mRCA 99% (DES PCI), RPL3 70%, mLAD 50% & ostial D1 70%, OM1 75%  . CARDIAC CATHETERIZATION N/A 10/16/2014   Procedure: Coronary Stent Intervention;  Surgeon: Belva Crome, MD;  Location: [facility];  Service: Cardiovascular;  mRCA 99% --> Synergy DES 4.0 mm x 28 mm (4.5 mm)  . knee pain    . NO PAST SURGERIES    . TRANSTHORACIC ECHOCARDIOGRAM      Social History:   reports that he has been smoking.  He has been smoking about 2.00 packs per day. He has never used smokeless tobacco. He reports that he does not drink alcohol or use drugs. Stopped drinking over 4 yrs ago. Drank beer heavy before that.  Smokes 2ppd Works doing odd jobs.  No Known Allergies  Family History  Problem  Relation Age of Onset  . Heart attack Father 67  . Hyperlipidemia Father   . Hypertension Father      Prior to Admission medications   Medication Sig Start Date End Date Taking? Authorizing Provider  aspirin EC 81 MG EC tablet Take 1 tablet (81 mg total) by mouth daily. 10/18/14  Yes Almyra Deforest, PA  atorvastatin (LIPITOR) 80 MG tablet Take 1 tablet (80 mg total) by mouth daily at 6 PM. 11/11/15  Yes Belva Crome, MD  clopidogrel (PLAVIX) 75 MG tablet Take 1 tablet (75 mg total) by mouth daily. 11/21/15  Yes Isaiah Serge, NP  DULoxetine (CYMBALTA) 30 MG capsule Take 30 mg by mouth  daily.   Yes Historical Provider, MD  gabapentin (NEURONTIN) 300 MG capsule Take 300 mg by mouth 3 (three) times daily. Reported on 01/28/2015   Yes Historical Provider, MD  HYDROcodone-acetaminophen (NORCO) 10-325 MG tablet Take 1 tablet by mouth every 6 (six) hours as needed for severe pain.    Yes Historical Provider, MD  lisinopril (PRINIVIL,ZESTRIL) 5 MG tablet Take 1 tablet (5 mg total) by mouth daily. 10/18/14  Yes Almyra Deforest, PA  LORazepam (ATIVAN) 0.5 MG tablet Take 0.1 mg by mouth every 8 (eight) hours as needed for anxiety.   Yes Historical Provider, MD  metoprolol tartrate (LOPRESSOR) 25 MG tablet Take 1.5 tablets (37.5 mg total) by mouth 2 (two) times daily. 11/21/15  Yes Isaiah Serge, NP  traMADol (ULTRAM) 50 MG tablet Take 1 tablet (50 mg total) by mouth every 6 (six) hours as needed. Patient taking differently: Take 50 mg by mouth every 6 (six) hours as needed for moderate pain or severe pain.  07/12/15  Yes Montine Circle, PA-C  HYDROcodone-acetaminophen (NORCO/VICODIN) 5-325 MG tablet Take 1-2 tablets by mouth every 6 (six) hours as needed. Patient not taking: Reported on 03/31/2016 07/12/15   Montine Circle, PA-C  nitroGLYCERIN (NITROSTAT) 0.4 MG SL tablet Place 1 tablet (0.4 mg total) under the tongue every 5 (five) minutes x 3 doses as needed for chest pain. 10/18/14   Almyra Deforest, PA  tiZANidine (ZANAFLEX) 4 MG tablet Take 4 mg by mouth every 8 (eight) hours as needed for muscle spasms. Reported on 01/28/2015    Historical Provider, MD    Physical Exam: Vitals:   03/31/16 1545 03/31/16 1619  BP:  119/76  Pulse: 104 106  Resp:  17  Temp:  98 F (36.7 C)  TempSrc:  Oral  SpO2: 96% 95%      Constitutional: NAD, calm, comfortable Eyes: PERTLA, lids and conjunctivae normal ENMT: Mucous membranes are moist. Posterior pharynx clear of any exudate or lesions. Normal dentition.  Neck: normal, supple, no masses, no thyromegaly Respiratory: clear to auscultation bilaterally, no  wheezing, no crackles. Normal respiratory effort. No accessory muscle use.  Cardiovascular: S1 & S2 heard, regular rate and rhythm, no murmurs / rubs / gallops. No extremity edema. 2+ pedal pulses. No carotid bruits.  Abdomen: No distension,  tenderness in LLQ, no masses palpated. No hepatosplenomegaly. Bowel sounds normal.  Musculoskeletal: no clubbing / cyanosis. No joint deformity upper and lower extremities. Good ROM, no contractures. Normal muscle tone.  Skin: no rashes, lesions, ulcers. No induration Neurologic: CN 2-12 grossly intact. Sensation intact, DTR normal. Strength 5/5 in all 4 limbs.  Psychiatric: Normal judgment and insight. Alert and oriented x 3. Normal mood.     Labs on Admission: I have personally reviewed following labs and imaging studies  CBC:  Recent  Labs Lab 03/31/16 1520  WBC 20.4*  HGB 15.3  HCT 43.7  MCV 88.1  PLT 193   Basic Metabolic Panel:  Recent Labs Lab 03/31/16 1520  NA 138  K 4.0  CL 108  CO2 20*  GLUCOSE 129*  BUN 17  CREATININE 1.13  CALCIUM 9.6   GFR: CrCl cannot be calculated (Unknown ideal weight.). Liver Function Tests:  Recent Labs Lab 03/31/16 1520  AST 24  ALT 29  ALKPHOS 68  BILITOT 1.1  PROT 8.2*  ALBUMIN 5.0    Recent Labs Lab 03/31/16 1520  LIPASE 20   No results for input(s): AMMONIA in the last 168 hours. Coagulation Profile:  Recent Labs Lab 03/31/16 1520  INR 0.95   Cardiac Enzymes: No results for input(s): CKTOTAL, CKMB, CKMBINDEX, TROPONINI in the last 168 hours. BNP (last 3 results) No results for input(s): PROBNP in the last 8760 hours. HbA1C: No results for input(s): HGBA1C in the last 72 hours. CBG: No results for input(s): GLUCAP in the last 168 hours. Lipid Profile: No results for input(s): CHOL, HDL, LDLCALC, TRIG, CHOLHDL, LDLDIRECT in the last 72 hours. Thyroid Function Tests: No results for input(s): TSH, T4TOTAL, FREET4, T3FREE, THYROIDAB in the last 72 hours. Anemia  Panel: No results for input(s): VITAMINB12, FOLATE, FERRITIN, TIBC, IRON, RETICCTPCT in the last 72 hours. Urine analysis:    Component Value Date/Time   COLORURINE YELLOW 03/31/2016 1347   APPEARANCEUR CLOUDY (A) 03/31/2016 1347   LABSPEC 1.020 03/31/2016 1347   PHURINE 5.0 03/31/2016 1347   GLUCOSEU NEGATIVE 03/31/2016 1347   HGBUR LARGE (A) 03/31/2016 1347   BILIRUBINUR NEGATIVE 03/31/2016 1347   KETONESUR 5 (A) 03/31/2016 1347   PROTEINUR NEGATIVE 03/31/2016 1347   NITRITE NEGATIVE 03/31/2016 1347   LEUKOCYTESUR NEGATIVE 03/31/2016 1347   Sepsis Labs: @LABRCNTIP (procalcitonin:4,lacticidven:4) )No results found for this or any previous visit (from the past 240 hour(s)).   Radiological Exams on Admission: Ct Renal Stone Study  Result Date: 03/31/2016 CLINICAL DATA:  Left flank pain EXAM: CT ABDOMEN AND PELVIS WITHOUT CONTRAST TECHNIQUE: Multidetector CT imaging of the abdomen and pelvis was performed following the standard protocol without IV contrast. COMPARISON:  None. FINDINGS: Lower chest: Lung bases demonstrate no acute consolidation or pleural effusion. Small distal esophageal hiatal hernia. Coronary artery calcifications. Hepatobiliary: No focal liver abnormality is seen. No gallstones, gallbladder wall thickening, or biliary dilatation. Pancreas: Unremarkable. No pancreatic ductal dilatation or surrounding inflammatory changes. Spleen: Normal in size without focal abnormality. Adrenals/Urinary Tract: The adrenal glands are within normal limits. The right kidney shows no calcified stones, hydronephrosis, or ureteral stone. Markedly abnormal appearance of left kidney which is enlarged. There is moderate left perinephric fat stranding. There is a heterogenous mass within the lower pole of the left kidney which contains internal calcifications and hyperdensity. This measures approximately 8.7 by 6.5 by 7.9 cm. There is hyperdense material extending into the renal collecting system.  There is moderate hydronephrosis with high density material present in the proximal left ureter. The bladder is grossly unremarkable. Stomach/Bowel: Stomach is within normal limits. Appendix appears normal. No evidence of bowel wall thickening, distention, or inflammatory changes. Vascular/Lymphatic: Aortic atherosclerosis. No enlarged abdominal or pelvic lymph nodes. Reproductive: Prostate is unremarkable. Other: No free air or free fluid Musculoskeletal: No acute or suspicious bone lesion. IMPRESSION: 1. Enlarged left kidney with moderate left perinephric fat stranding. There is a a 8.7 cm heterogenous mass containing calcifications within the lower pole of left kidney which is suspicious for neoplasm.  There is moderate hydronephrosis of the left kidney ; this is suspected to be secondary to high density material within the proximal left ureter which may represent blood clot. There is additional hemorrhage or thrombus within the dilated left renal collecting system. Further evaluation with contrast enhanced CT or MRI would be helpful. 2. Right kidney is negative for nephrolithiasis, hydronephrosis or ureteral stone. 3. Small esophageal hiatal hernia Electronically Signed   By: Donavan Foil M.D.   On: 03/31/2016 16:32     Assessment/Plan Principal Problem:   Renal mass L hydronephrosis Hematuria Severe L lower abdominal pain/ flank pain - likely has renal cancer with clot obstructing the ureter-  - pain control with Oxycodone 10 mg QID (takes 10 mg of Hydrocodone QID chronically) and PRN IV Dilaudid - Flomax, IVF - due to elevated WBC, send U culture- I have not started an antibiotic yet - Urology has been called   Chronic pain -  left hip pain- see narcotics above   Presence of DES in RCA : Synergy 4.0 mm x 28 mm (4.5 mm) - will hold Aspirin/Plavix for now due to bleeding - cont Lipitor, Metoprolol   Essential hypertension - cont Metoprolol   Nicotine abuse - Nicotine  patch  Depression/ anxiety - situational- cont Cymbalta- and Neurontin at bedtime for now  DVT prophylaxis: SCDs Code Status: Full code  Family Communication:   Disposition Plan: home when stable  Consults called: I spoke with Dr Tresa Moore, Urology Admission status: inpatient    [facility] MD [facility] Pager: [email or web address] Password [email or web address] 7PM-7AM, please contact night-coverage   03/31/2016, 6:38 PM

## 2016-03-31 NOTE — ED Provider Notes (Signed)
[facility] DEPT Provider Note   CSN: [id number] Arrival date & time: 03/31/16  1335     History   Chief Complaint Chief Complaint  Patient presents with  . Abdominal Pain  . Urinary Retention    HPI ADOLPHO MEENACH is a 51 y.o. male.  HPI Patient reports he got sudden onset of severe left flank and the lower abdomen pain. He reports this morning it was radiating to his testicle. He reports he seen a little bit of blood in his urine. He reports now it just seems to hurt a lot in his back. Ports the pain is intolerable the worse he's ever had. He reports it has made him nauseated. He denies prior history of kidney stone. No radiation into the leg or lower extremity weakness. Past Medical History:  Diagnosis Date  . Chronic pain 10/16/2014   On Narcotics @ home.  . 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 Essential hypertension 10/17/2014  . Hyperlipidemia with target LDL less than 70 10/16/2014  . Presence of DES in RCA : Synergy 4.0 mm x 28 mm (4.5 mm) 10/17/2014  . ST elevation myocardial infarction (STEMI) of inferior wall, initial episode of care ([place]) 10/16/2014  . Tobacco abuse 10/16/2014    Patient Active Problem List   Diagnosis Date Noted  . Renal mass 03/31/2016  . Nicotine abuse 03/31/2016  . Hematuria 03/31/2016  . Hydronephrosis of left kidney 03/31/2016  . Hyperlipidemia 06/20/2015  . Sleep disturbance 06/20/2015  . Left hip pain 06/20/2015  . Anxiety and depression 10/23/2014  . 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 Presence of DES in RCA : Synergy 4.0 mm x 28 mm (4.5 mm) 10/17/2014  . Essential hypertension 10/17/2014  . Tobacco abuse 10/16/2014  . Hyperlipidemia with target LDL less than 70 10/16/2014  . Chronic pain 10/16/2014  . ST elevation myocardial infarction  (STEMI) of inferior wall, initial episode of care [facility]) 10/16/2014    Past Surgical History:  Procedure Laterality Date  . CARDIAC CATHETERIZATION N/A 10/16/2014   Procedure: Left Heart Cath and Coronary Angiography;  Surgeon: Belva Crome, MD;  Location: [facility];  Service: Cardiovascular;  LM ~45%, mRCA 99% (DES PCI), RPL3 70%, mLAD 50% & ostial D1 70%, OM1 75%  . CARDIAC CATHETERIZATION N/A 10/16/2014   Procedure: Coronary Stent Intervention;  Surgeon: Belva Crome, MD;  Location: [facility];  Service: Cardiovascular;  mRCA 99% --> Synergy DES 4.0 mm x 28 mm (4.5 mm)  . knee pain    . NO PAST SURGERIES    . TRANSTHORACIC ECHOCARDIOGRAM         Home Medications    Prior to Admission medications   Medication Sig Start Date End Date Taking? Authorizing Provider  aspirin EC 81 MG EC tablet Take 1 tablet (81 mg total) by mouth daily. 10/18/14  Yes Almyra Deforest, PA  atorvastatin (LIPITOR) 80 MG tablet Take 1 tablet (80 mg total) by mouth daily at 6 PM. 11/11/15  Yes Belva Crome, MD  clopidogrel (PLAVIX) 75 MG tablet Take 1 tablet (75 mg total) by mouth daily. 11/21/15  Yes Isaiah Serge, NP  DULoxetine (CYMBALTA) 30 MG capsule Take 30 mg by mouth daily.   Yes Historical Provider, MD  gabapentin (NEURONTIN) 300 MG capsule Take  300 mg by mouth 3 (three) times daily. Reported on 01/28/2015   Yes Historical Provider, MD  HYDROcodone-acetaminophen (NORCO) 10-325 MG tablet Take 1 tablet by mouth every 6 (six) hours as needed for severe pain.    Yes Historical Provider, MD  lisinopril (PRINIVIL,ZESTRIL) 5 MG tablet Take 1 tablet (5 mg total) by mouth daily. 10/18/14  Yes Almyra Deforest, PA  LORazepam (ATIVAN) 0.5 MG tablet Take 0.1 mg by mouth every 8 (eight) hours as needed for anxiety.   Yes Historical Provider, MD  metoprolol tartrate (LOPRESSOR) 25 MG tablet Take 1.5 tablets (37.5 mg total) by mouth 2 (two) times daily. 11/21/15  Yes Isaiah Serge, NP  traMADol (ULTRAM) 50 MG tablet  Take 1 tablet (50 mg total) by mouth every 6 (six) hours as needed. Patient taking differently: Take 50 mg by mouth every 6 (six) hours as needed for moderate pain or severe pain.  07/12/15  Yes Montine Circle, PA-C  HYDROcodone-acetaminophen (NORCO/VICODIN) 5-325 MG tablet Take 1-2 tablets by mouth every 6 (six) hours as needed. Patient not taking: Reported on 03/31/2016 07/12/15   Montine Circle, PA-C  nitroGLYCERIN (NITROSTAT) 0.4 MG SL tablet Place 1 tablet (0.4 mg total) under the tongue every 5 (five) minutes x 3 doses as needed for chest pain. 10/18/14   Almyra Deforest, PA  tiZANidine (ZANAFLEX) 4 MG tablet Take 4 mg by mouth every 8 (eight) hours as needed for muscle spasms. Reported on 01/28/2015    Historical Provider, MD    Family History Family History  Problem Relation Age of Onset  . Heart attack Father 65  . Hyperlipidemia Father   . Hypertension Father     Social History Social History  Substance Use Topics  . Smoking status: Current Every Day Smoker    Packs/day: 2.00  . Smokeless tobacco: Never Used     Comment: 2 ppd  . Alcohol use No     Allergies   Patient has no known allergies.   Review of Systems Review of Systems  10 Systems reviewed and are negative for acute change except as noted in the HPI.  Physical Exam Updated Vital Signs BP (!) 148/98 (BP Location: Left Arm)   Pulse 94   Temp 98.8 F (37.1 C) (Oral)   Resp 16   Ht 5\' 10"  (1.778 m)   Wt 188 lb 6.4 oz (85.5 kg)   SpO2 97%   BMI 27.03 kg/m   Physical Exam  Constitutional: He is oriented to person, place, and time.  Patient is alert and nontoxic. He is sitting and standing up and down. He is recurrently yelling out in pain. He is nontoxic and alert. No respiratory distress.  HENT:  Head: Normocephalic and atraumatic.  Eyes: EOM are normal.  Cardiovascular: Normal rate, regular rhythm, normal heart sounds and intact distal pulses.   Pulmonary/Chest: Effort normal and breath sounds normal.    Abdominal: Soft. He exhibits no distension.  Minimal left lower quadrant discomfort to palpation. [place] CVA tenderness. No skin rash or soft tissue abnormality.  Musculoskeletal: Normal range of motion. He exhibits no edema or deformity.  Patient is up and ambulating in the room.  Neurological: He is alert and oriented to person, place, and time. He exhibits normal muscle tone. Coordination normal.  Skin: Skin is warm and dry.  Psychiatric: He has a normal mood and affect.     ED Treatments / Results  Labs (all labs ordered are listed, but only abnormal results are displayed) Labs Reviewed  URINALYSIS, ROUTINE W REFLEX MICROSCOPIC - Abnormal; Notable for the following:       Result Value   APPearance CLOUDY (*)    Hgb urine dipstick LARGE (*)    Ketones, ur 5 (*)    Bacteria, UA RARE (*)    Squamous Epithelial / LPF 0-5 (*)    All other components within normal limits  COMPREHENSIVE METABOLIC PANEL - Abnormal; Notable for the following:    CO2 20 (*)    Glucose, Bld 129 (*)    Total Protein 8.2 (*)    All other components within normal limits  CBC - Abnormal; Notable for the following:    WBC 20.4 (*)    All other components within normal limits  BASIC METABOLIC PANEL - Abnormal; Notable for the following:    Glucose, Bld 104 (*)    Creatinine, Ser 1.34 (*)    Calcium 8.7 (*)    All other components within normal limits  CBC - Abnormal; Notable for the following:    WBC 16.0 (*)    HCT 38.5 (*)    All other components within normal limits  URINE CULTURE  LIPASE, BLOOD  PROTIME-INR  HIV ANTIBODY (ROUTINE TESTING)    EKG  EKG Interpretation None       Radiology Ct Chest W Contrast  Result Date: 04/01/2016 CLINICAL DATA:  Left renal mass. EXAM: CT CHEST, ABDOMEN, AND PELVIS WITH CONTRAST TECHNIQUE: Multidetector CT imaging of the chest, abdomen and pelvis was performed following the standard protocol during bolus administration of intravenous contrast. CONTRAST:   133mL ISOVUE-300 IOPAMIDOL (ISOVUE-300) INJECTION 61%, 78mL ISOVUE-300 IOPAMIDOL (ISOVUE-300) INJECTION 61% COMPARISON:  CT KUB 03/31/2016 FINDINGS: CT CHEST FINDINGS Cardiovascular: Non aneurysmal aorta. Atherosclerotic vascular calcifications. Coronary artery calcifications. Normal heart size. No pericardial effusion. Mediastinum/Nodes: No significantly enlarged mediastinal or hilar nodes. Normal thyroid gland. Trachea midline. Esophagus within normal limits. Lungs/Pleura: There are no pulmonary nodules visualized. There is no acute infiltrate or pleural effusion. Mild right posterior nodular pleural thickening. Musculoskeletal: No acute or suspicious bone lesion. CT ABDOMEN PELVIS FINDINGS Hepatobiliary: No focal liver abnormality is seen. No gallstones, gallbladder wall thickening, or biliary dilatation. Pancreas: Unremarkable. No pancreatic ductal dilatation or surrounding inflammatory changes. Spleen: Normal in size without focal abnormality. Adrenals/Urinary Tract: Adrenal glands are within normal limits. The right kidney is unremarkable. Enlarged left kidney. Moderate left perinephric fat stranding. Cystic and solid enhancing mass in the lower pole of left kidney with internal calcifications. This measures approximately 9.8 by 6.8 by 7.4 cm. Left renal vein is patent. There is delayed excretion of contrast into the left renal collecting system. There is moderate hydronephrosis. There is high density material within the dilated renal collecting system and proximal left ureter. Stomach/Bowel: Stomach is within normal limits. Appendix appears normal. No evidence of bowel wall thickening, distention, or inflammatory changes. Vascular/Lymphatic: Subcentimeter retroperitoneal lymph nodes. No grossly enlarged abdominal lymph nodes. Reproductive: Prostate is unremarkable. Other: No free air or free fluid. Musculoskeletal: No acute osseous abnormality. Marked arthritis of left hip with diffuse sclerosis in the left  acetabulum and left femoral head. IMPRESSION: 1. No CT evidence for pulmonary nodule. Clear lung fields. No adenopathy in the chest. 2. 9.8 cm cystic and solid suspicious enhancing mass in the left kidney. Patent left renal vein. Moderate left hydronephrosis suspected to be secondary to high density material/possible thrombus within the proximal left ureter and left renal collecting system. There is delayed excretion of contrast into the left renal collecting system. 3. Marked  arthritis of left hip. There is diffuse sclerosis in the left acetabulum and iliac bone suspected to be secondary to arthritis of the left hip. Electronically Signed   By: Donavan Foil M.D.   On: 04/01/2016 00:47   Ct Abdomen Pelvis W Contrast  Result Date: 04/01/2016 CLINICAL DATA:  Left renal mass. EXAM: CT CHEST, ABDOMEN, AND PELVIS WITH CONTRAST TECHNIQUE: Multidetector CT imaging of the chest, abdomen and pelvis was performed following the standard protocol during bolus administration of intravenous contrast. CONTRAST:  162mL ISOVUE-300 IOPAMIDOL (ISOVUE-300) INJECTION 61%, 39mL ISOVUE-300 IOPAMIDOL (ISOVUE-300) INJECTION 61% COMPARISON:  CT KUB 03/31/2016 FINDINGS: CT CHEST FINDINGS Cardiovascular: Non aneurysmal aorta. Atherosclerotic vascular calcifications. Coronary artery calcifications. Normal heart size. No pericardial effusion. Mediastinum/Nodes: No significantly enlarged mediastinal or hilar nodes. Normal thyroid gland. Trachea midline. Esophagus within normal limits. Lungs/Pleura: There are no pulmonary nodules visualized. There is no acute infiltrate or pleural effusion. Mild right posterior nodular pleural thickening. Musculoskeletal: No acute or suspicious bone lesion. CT ABDOMEN PELVIS FINDINGS Hepatobiliary: No focal liver abnormality is seen. No gallstones, gallbladder wall thickening, or biliary dilatation. Pancreas: Unremarkable. No pancreatic ductal dilatation or surrounding inflammatory changes. Spleen: Normal in  size without focal abnormality. Adrenals/Urinary Tract: Adrenal glands are within normal limits. The right kidney is unremarkable. Enlarged left kidney. Moderate left perinephric fat stranding. Cystic and solid enhancing mass in the lower pole of left kidney with internal calcifications. This measures approximately 9.8 by 6.8 by 7.4 cm. Left renal vein is patent. There is delayed excretion of contrast into the left renal collecting system. There is moderate hydronephrosis. There is high density material within the dilated renal collecting system and proximal left ureter. Stomach/Bowel: Stomach is within normal limits. Appendix appears normal. No evidence of bowel wall thickening, distention, or inflammatory changes. Vascular/Lymphatic: Subcentimeter retroperitoneal lymph nodes. No grossly enlarged abdominal lymph nodes. Reproductive: Prostate is unremarkable. Other: No free air or free fluid. Musculoskeletal: No acute osseous abnormality. Marked arthritis of left hip with diffuse sclerosis in the left acetabulum and left femoral head. IMPRESSION: 1. No CT evidence for pulmonary nodule. Clear lung fields. No adenopathy in the chest. 2. 9.8 cm cystic and solid suspicious enhancing mass in the left kidney. Patent left renal vein. Moderate left hydronephrosis suspected to be secondary to high density material/possible thrombus within the proximal left ureter and left renal collecting system. There is delayed excretion of contrast into the left renal collecting system. 3. Marked arthritis of left hip. There is diffuse sclerosis in the left acetabulum and iliac bone suspected to be secondary to arthritis of the left hip. Electronically Signed   By: Donavan Foil M.D.   On: 04/01/2016 00:47   Ct Renal Stone Study  Result Date: 03/31/2016 CLINICAL DATA:  Left flank pain EXAM: CT ABDOMEN AND PELVIS WITHOUT CONTRAST TECHNIQUE: Multidetector CT imaging of the abdomen and pelvis was performed following the standard protocol  without IV contrast. COMPARISON:  None. FINDINGS: Lower chest: Lung bases demonstrate no acute consolidation or pleural effusion. Small distal esophageal hiatal hernia. Coronary artery calcifications. Hepatobiliary: No focal liver abnormality is seen. No gallstones, gallbladder wall thickening, or biliary dilatation. Pancreas: Unremarkable. No pancreatic ductal dilatation or surrounding inflammatory changes. Spleen: Normal in size without focal abnormality. Adrenals/Urinary Tract: The adrenal glands are within normal limits. The right kidney shows no calcified stones, hydronephrosis, or ureteral stone. Markedly abnormal appearance of left kidney which is enlarged. There is moderate left perinephric fat stranding. There is a heterogenous mass within the lower pole  of the left kidney which contains internal calcifications and hyperdensity. This measures approximately 8.7 by 6.5 by 7.9 cm. There is hyperdense material extending into the renal collecting system. There is moderate hydronephrosis with high density material present in the proximal left ureter. The bladder is grossly unremarkable. Stomach/Bowel: Stomach is within normal limits. Appendix appears normal. No evidence of bowel wall thickening, distention, or inflammatory changes. Vascular/Lymphatic: Aortic atherosclerosis. No enlarged abdominal or pelvic lymph nodes. Reproductive: Prostate is unremarkable. Other: No free air or free fluid Musculoskeletal: No acute or suspicious bone lesion. IMPRESSION: 1. Enlarged left kidney with moderate left perinephric fat stranding. There is a a 8.7 cm heterogenous mass containing calcifications within the lower pole of left kidney which is suspicious for neoplasm. There is moderate hydronephrosis of the left kidney ; this is suspected to be secondary to high density material within the proximal left ureter which may represent blood clot. There is additional hemorrhage or thrombus within the dilated left renal collecting  system. Further evaluation with contrast enhanced CT or MRI would be helpful. 2. Right kidney is negative for nephrolithiasis, hydronephrosis or ureteral stone. 3. Small esophageal hiatal hernia Electronically Signed   By: Donavan Foil M.D.   On: 03/31/2016 16:32    Procedures Procedures (including critical care time)  Medications Ordered in ED Medications  0.9 %  sodium chloride infusion (1,000 mLs Intravenous New Bag/Given 03/31/16 1546)    Followed by  0.9 %  sodium chloride infusion (1,000 mLs Intravenous New Bag/Given 03/31/16 1545)  0.9 %  sodium chloride infusion ( Intravenous New Bag/Given 04/01/16 0615)  acetaminophen (TYLENOL) tablet 650 mg (not administered)    Or  acetaminophen (TYLENOL) suppository 650 mg (not administered)  ondansetron (ZOFRAN) tablet 4 mg ( Oral See Alternative 04/01/16 0800)    Or  ondansetron (ZOFRAN) injection 4 mg (4 mg Intravenous Given 04/01/16 0800)  HYDROmorphone (DILAUDID) injection 1 mg (1 mg Intravenous Given 04/01/16 0801)  atorvastatin (LIPITOR) tablet 80 mg (not administered)  DULoxetine (CYMBALTA) DR capsule 30 mg (30 mg Oral Given 03/31/16 1943)  metoprolol tartrate (LOPRESSOR) tablet 37.5 mg (37.5 mg Oral Given 03/31/16 2210)  gabapentin (NEURONTIN) capsule 300 mg (300 mg Oral Given 03/31/16 2210)  oxyCODONE (Oxy IR/ROXICODONE) immediate release tablet 10 mg (10 mg Oral Given 03/31/16 2349)  tamsulosin (FLOMAX) capsule 0.4 mg (0.4 mg Oral Given 03/31/16 1944)  iopamidol (ISOVUE-300) 61 % injection (not administered)  iopamidol (ISOVUE-300) 61 % injection (not administered)  iopamidol (ISOVUE-300) 61 % injection 100 mL (not administered)  alum & mag hydroxide-simeth (MAALOX/MYLANTA) 200-200-20 MG/5ML suspension 15 mL (15 mLs Oral Given 04/01/16 0345)  nicotine (NICODERM CQ - dosed in mg/24 hours) patch 21 mg (21 mg Transdermal Patch Applied 04/01/16 0344)  HYDROmorphone (DILAUDID) injection 1 mg (1 mg Intravenous Given 03/31/16 1518)  ondansetron  (ZOFRAN) injection 4 mg (4 mg Intravenous Given 03/31/16 1518)  ketorolac (TORADOL) 30 MG/ML injection 30 mg (30 mg Intravenous Given 03/31/16 1528)  HYDROmorphone (DILAUDID) injection 2 mg (2 mg Intravenous Given 03/31/16 1541)  atorvastatin (LIPITOR) tablet 80 mg (80 mg Oral Given 03/31/16 1943)  iopamidol (ISOVUE-300) 61 % injection 30 mL (30 mLs Oral Contrast Given 03/31/16 1900)  iopamidol (ISOVUE-300) 61 % injection 100 mL (100 mLs Intravenous Contrast Given 03/31/16 2127)  LORazepam (ATIVAN) injection 1 mg (1 mg Intravenous Given 04/01/16 0345)     Initial Impression / Assessment and Plan / ED Course  I have reviewed the triage vital signs and the nursing notes.  Pertinent  labs & imaging results that were available during my care of the patient were reviewed by me and considered in my medical decision making (see chart for details).    Consultation: Urology call pending Dr. Ellender Hose (EDP) receive the call. Consultation: [facility] hospitalist call pending Dr. Ellender Hose to receive call for admission.  Final Clinical Impressions(s) / ED Diagnoses   Final diagnoses:  Renal neoplasm  Other hydronephrosis  Intractable pain   CT has been notified a mass in the left kidney constricting the ureter. Patient has associated hydronephrosis. CT read indicates material obstructing the ureter which is opined to be clot. Patient's general condition had been well up until acute onset of pain today. Findings are not suspicious at this time for infectious illness. Patient is up and ambulatory without any neurologic dysfunction. He did have severe pain and required first a 1 mg dose of Dilaudid and Toradol which was not adequate for pain control and then a 2 mg dose of Dilaudid was effective. Patient remained alert and appropriate with no evidence of oversedation or intolerance of Dilaudid. Plan will be for admission and urology consultation.  New Prescriptions Current Discharge Medication List       Charlesetta Shanks, MD 04/01/16 [phone]

## 2016-03-31 NOTE — Consult Note (Signed)
Reason for Consult: Large Left Renal Mass, Gross Hematuria  Referring Physician: Calvert Cantor MD  Milda Smart is an 51 y.o. male.    HPI:   1 - Large Left Renal Mass - 8cm+ left lower pole renal mass with heterogeneous coarse calcifications by non-con CT 03/2016 on eval flank pain and hematuria. No dedicated contrast imaging yet. Appears 2 artery (lower accessory) / 1 vein left renovascular anatomy. No bulky hilar adenopathy. He lost his common law wife to metastatic GYN cancer few mos ago.   2 -  Gross Hematuria - new gross hematuria on / off x few mos 03/2016. 60PY smoker, still smokes. CT with large left renal mass as per above. Cysto pending. He is on ASA + Plavix for h/o CAD/Stent 2016.  PMH sig for chronic left hip pain / chronic narcotics, CAD/MI/Stent/Plavix (2016).   Today "Loraine Leriche" is seen in consultation for above.   Past Medical History:  Diagnosis Date  . Chronic pain 10/16/2014   On Narcotics @ home.  . 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 Essential hypertension 10/17/2014  . Hyperlipidemia with target LDL less than 70 10/16/2014  . Presence of DES in RCA : Synergy 4.0 mm x 28 mm (4.5 mm) 10/17/2014  . ST elevation myocardial infarction (STEMI) of inferior wall, initial episode of care (HCC) 10/16/2014  . Tobacco abuse 10/16/2014    Past Surgical History:  Procedure Laterality Date  . CARDIAC CATHETERIZATION N/A 10/16/2014   Procedure: Left Heart Cath and Coronary Angiography;  Surgeon: Lyn Records, MD;  Location: [facility] INVASIVE CV LAB;  Service: Cardiovascular;  LM ~45%, mRCA 99% (DES PCI), RPL3 70%, mLAD 50% & ostial D1 70%, OM1 75%  . CARDIAC CATHETERIZATION N/A 10/16/2014   Procedure: Coronary Stent Intervention;  Surgeon: Lyn Records, MD;  Location: [facility] INVASIVE CV LAB;  Service: Cardiovascular;  mRCA 99% --> Synergy DES 4.0 mm x 28 mm  (4.5 mm)  . knee pain    . NO PAST SURGERIES    . TRANSTHORACIC ECHOCARDIOGRAM      Family History  Problem Relation Age of Onset  . Heart attack Father 63  . Hyperlipidemia Father   . Hypertension Father     Social History:  reports that he has been smoking.  He has been smoking about 2.00 packs per day. He has never used smokeless tobacco. He reports that he does not drink alcohol or use drugs.  Allergies: No Known Allergies  Medications: I have reviewed the patient's current medications.  Results for orders placed or performed during the hospital encounter of 03/31/16 (from the past 48 hour(s))  Urinalysis, Routine w reflex microscopic- may I&O cath if menses     Status: Abnormal   Collection Time: 03/31/16  1:47 PM  Result Value Ref Range   Color, Urine YELLOW YELLOW   APPearance CLOUDY (A) CLEAR   Specific Gravity, Urine 1.020 1.005 - 1.030   pH 5.0 5.0 - 8.0   Glucose, UA NEGATIVE NEGATIVE mg/dL   Hgb urine dipstick LARGE (A) NEGATIVE   Bilirubin Urine NEGATIVE NEGATIVE   Ketones, ur 5 (A) NEGATIVE mg/dL   Protein, ur NEGATIVE NEGATIVE mg/dL   Nitrite NEGATIVE NEGATIVE   Leukocytes, UA NEGATIVE NEGATIVE   RBC / HPF TOO NUMEROUS TO COUNT 0 - 5 RBC/hpf   WBC, UA 6-30 0 -  5 WBC/hpf   Bacteria, UA RARE (A) NONE SEEN   Squamous Epithelial / LPF 0-5 (A) NONE SEEN   Mucous PRESENT   Lipase, blood     Status: None   Collection Time: 03/31/16  3:20 PM  Result Value Ref Range   Lipase 20 11 - 51 U/L  Comprehensive metabolic panel     Status: Abnormal   Collection Time: 03/31/16  3:20 PM  Result Value Ref Range   Sodium 138 135 - 145 mmol/L   Potassium 4.0 3.5 - 5.1 mmol/L   Chloride 108 101 - 111 mmol/L   CO2 20 (L) 22 - 32 mmol/L   Glucose, Bld 129 (H) 65 - 99 mg/dL   BUN 17 6 - 20 mg/dL   Creatinine, Ser 1.13 0.61 - 1.24 mg/dL   Calcium 9.6 8.9 - 10.3 mg/dL   Total Protein 8.2 (H) 6.5 - 8.1 g/dL   Albumin 5.0 3.5 - 5.0 g/dL   AST 24 15 - 41 U/L   ALT 29 17 - 63  U/L   Alkaline Phosphatase 68 38 - 126 U/L   Total Bilirubin 1.1 0.3 - 1.2 mg/dL   GFR calc non Af Amer >60 >60 mL/min   GFR calc Af Amer >60 >60 mL/min    Comment: (NOTE) The eGFR has been calculated using the CKD EPI equation. This calculation has not been validated in all clinical situations. eGFR's persistently <60 mL/min signify possible Chronic Kidney Disease.    Anion gap 10 5 - 15  CBC     Status: Abnormal   Collection Time: 03/31/16  3:20 PM  Result Value Ref Range   WBC 20.4 (H) 4.0 - 10.5 K/uL   RBC 4.96 4.22 - 5.81 MIL/uL   Hemoglobin 15.3 13.0 - 17.0 g/dL   HCT 43.7 39.0 - 52.0 %   MCV 88.1 78.0 - 100.0 fL   MCH 30.8 26.0 - 34.0 pg   MCHC 35.0 30.0 - 36.0 g/dL   RDW 12.5 11.5 - 15.5 %   Platelets 254 150 - 400 K/uL  Protime-INR     Status: None   Collection Time: 03/31/16  3:20 PM  Result Value Ref Range   Prothrombin Time 12.6 11.4 - 15.2 seconds   INR 0.95     Ct Renal Stone Study  Result Date: 03/31/2016 CLINICAL DATA:  Left flank pain EXAM: CT ABDOMEN AND PELVIS WITHOUT CONTRAST TECHNIQUE: Multidetector CT imaging of the abdomen and pelvis was performed following the standard protocol without IV contrast. COMPARISON:  None. FINDINGS: Lower chest: Lung bases demonstrate no acute consolidation or pleural effusion. Small distal esophageal hiatal hernia. Coronary artery calcifications. Hepatobiliary: No focal liver abnormality is seen. No gallstones, gallbladder wall thickening, or biliary dilatation. Pancreas: Unremarkable. No pancreatic ductal dilatation or surrounding inflammatory changes. Spleen: Normal in size without focal abnormality. Adrenals/Urinary Tract: The adrenal glands are within normal limits. The right kidney shows no calcified stones, hydronephrosis, or ureteral stone. Markedly abnormal appearance of left kidney which is enlarged. There is moderate left perinephric fat stranding. There is a heterogenous mass within the lower pole of the left kidney  which contains internal calcifications and hyperdensity. This measures approximately 8.7 by 6.5 by 7.9 cm. There is hyperdense material extending into the renal collecting system. There is moderate hydronephrosis with high density material present in the proximal left ureter. The bladder is grossly unremarkable. Stomach/Bowel: Stomach is within normal limits. Appendix appears normal. No evidence of bowel wall thickening, distention, or inflammatory changes.  Vascular/Lymphatic: Aortic atherosclerosis. No enlarged abdominal or pelvic lymph nodes. Reproductive: Prostate is unremarkable. Other: No free air or free fluid Musculoskeletal: No acute or suspicious bone lesion. IMPRESSION: 1. Enlarged left kidney with moderate left perinephric fat stranding. There is a a 8.7 cm heterogenous mass containing calcifications within the lower pole of left kidney which is suspicious for neoplasm. There is moderate hydronephrosis of the left kidney ; this is suspected to be secondary to high density material within the proximal left ureter which may represent blood clot. There is additional hemorrhage or thrombus within the dilated left renal collecting system. Further evaluation with contrast enhanced CT or MRI would be helpful. 2. Right kidney is negative for nephrolithiasis, hydronephrosis or ureteral stone. 3. Small esophageal hiatal hernia Electronically Signed   By: Donavan Foil M.D.   On: 03/31/2016 16:32    Review of Systems  Constitutional: Positive for malaise/fatigue and weight loss.  HENT: Negative.   Respiratory: Negative.   Cardiovascular: Negative.   Gastrointestinal: Positive for abdominal pain.  Genitourinary: Positive for flank pain and hematuria.  Skin: Negative.   Neurological: Negative.   Endo/Heme/Allergies: Negative.   Psychiatric/Behavioral: Negative.    Blood pressure 134/99, pulse 111, temperature 98.5 F (36.9 C), temperature source Oral, resp. rate 17, height '5\' 10"'$  (1.778 m), weight 85.5  kg (188 lb 6.4 oz), SpO2 98 %. Physical Exam  Constitutional: He is oriented to person, place, and time. He appears well-developed.  HENT:  Head: Normocephalic.  Eyes: Pupils are equal, round, and reactive to light.  Neck: Normal range of motion.  Respiratory: Effort normal.  GI: Soft.  Genitourinary: Penis normal.  Genitourinary Comments: Some mild left CVAT. Palpable left abdominal / flank fullness c/w mass.   Musculoskeletal: Normal range of motion.  Neurological: He is alert and oriented to person, place, and time.  Skin: Skin is warm and dry.  Psychiatric: He has a normal mood and affect. His behavior is normal. Judgment and thought content normal.    Assessment/Plan:  1 - Large Left Renal Mass - this is most likely stage 3+ renal cell carcinoma. Discussed natural history with pt and rec of complete staging studies in house (CT chest/abd/pelvis with contrast) followed by Left radical nephrectomy next avail (likely few weeks) with hold blood thinners prior.   Staging chest/abd imaging ordered today.   2 -  Gross Hematuria - most likely from mass above. CT as per above and will tentatively plan on cysto at time of nephrectomy to complete eval.  Please call me directly with questions.   Derek Laughter 03/31/2016, 7:27 PM

## 2016-03-31 NOTE — ED Notes (Signed)
ED Provider at bedside. 

## 2016-03-31 NOTE — ED Notes (Addendum)
Pt provided with a urinal for urine sample when available.

## 2016-03-31 NOTE — ED Triage Notes (Signed)
Patient comes from home for LLQ pain by [facility].  Patient reports that he hasnt urinated at all today.  158/114, 103Hr, 24R, 97% CBG 146

## 2016-03-31 NOTE — ED Notes (Signed)
Patient transported to CT 

## 2016-03-31 NOTE — ED Notes (Signed)
Hospitalist at bedside 

## 2016-04-01 DIAGNOSIS — D49519 Neoplasm of unspecified behavior of unspecified kidney: Secondary | ICD-10-CM

## 2016-04-01 DIAGNOSIS — N1339 Other hydronephrosis: Secondary | ICD-10-CM

## 2016-04-01 DIAGNOSIS — R52 Pain, unspecified: Secondary | ICD-10-CM

## 2016-04-01 LAB — BASIC METABOLIC PANEL
Anion gap: 6 (ref 5–15)
BUN: 19 mg/dL (ref 6–20)
CHLORIDE: 105 mmol/L (ref 101–111)
CO2: 25 mmol/L (ref 22–32)
Calcium: 8.7 mg/dL — ABNORMAL LOW (ref 8.9–10.3)
Creatinine, Ser: 1.34 mg/dL — ABNORMAL HIGH (ref 0.61–1.24)
GFR calc non Af Amer: >60 mL/min (ref >60)
Glucose, Bld: 104 mg/dL — ABNORMAL HIGH (ref 65–99)
POTASSIUM: 4 mmol/L (ref 3.5–5.1)
SODIUM: 136 mmol/L (ref 135–145)

## 2016-04-01 LAB — CBC
HEMATOCRIT: 38.5 % — AB (ref 39.0–52.0)
Hemoglobin: 13.1 g/dL (ref 13.0–17.0)
MCH: 30.8 pg (ref 26.0–34.0)
MCHC: 34 g/dL (ref 30.0–36.0)
MCV: 90.4 fL (ref 78.0–100.0)
Platelets: 197 10*3/uL (ref 150–400)
RBC: 4.26 MIL/uL (ref 4.22–5.81)
RDW: 12.9 % (ref 11.5–15.5)
WBC: 16 10*3/uL — AB (ref 4.0–10.5)

## 2016-04-01 LAB — HIV ANTIBODY (ROUTINE TESTING W REFLEX): HIV Screen 4th Generation wRfx: NONREACTIVE

## 2016-04-01 MED ORDER — ALUM & MAG HYDROXIDE-SIMETH 200-200-20 MG/5ML PO SUSP
15.0000 mL | Freq: Four times a day (QID) | ORAL | Status: DC | PRN
  Administered 2016-04-01: 15 mL via ORAL
  Filled 2016-04-01: qty 30

## 2016-04-01 MED ORDER — MORPHINE SULFATE (PF) 4 MG/ML IV SOLN
1.0000 mg | INTRAVENOUS | Status: DC | PRN
  Administered 2016-04-01 – 2016-04-02 (×2): 1 mg via INTRAVENOUS
  Filled 2016-04-01 (×2): qty 1

## 2016-04-01 MED ORDER — ENSURE ENLIVE PO LIQD
237.0000 mL | Freq: Two times a day (BID) | ORAL | Status: DC
  Administered 2016-04-02: 237 mL via ORAL

## 2016-04-01 MED ORDER — GI COCKTAIL ~~LOC~~
30.0000 mL | Freq: Once | ORAL | Status: AC
  Administered 2016-04-01: 30 mL via ORAL
  Filled 2016-04-01: qty 30

## 2016-04-01 MED ORDER — HYDROCODONE-ACETAMINOPHEN 10-325 MG PO TABS
2.0000 | ORAL_TABLET | Freq: Four times a day (QID) | ORAL | Status: DC | PRN
  Administered 2016-04-01 – 2016-04-02 (×4): 2 via ORAL
  Filled 2016-04-01 (×4): qty 2

## 2016-04-01 MED ORDER — LORAZEPAM 2 MG/ML IJ SOLN
1.0000 mg | Freq: Once | INTRAMUSCULAR | Status: AC
  Administered 2016-04-01: 1 mg via INTRAVENOUS
  Filled 2016-04-01: qty 1

## 2016-04-01 MED ORDER — PANTOPRAZOLE SODIUM 40 MG PO TBEC
40.0000 mg | DELAYED_RELEASE_TABLET | Freq: Two times a day (BID) | ORAL | Status: DC
  Administered 2016-04-01 – 2016-04-02 (×3): 40 mg via ORAL
  Filled 2016-04-01 (×3): qty 1

## 2016-04-01 MED ORDER — NICOTINE 21 MG/24HR TD PT24
21.0000 mg | MEDICATED_PATCH | Freq: Every day | TRANSDERMAL | Status: DC
  Administered 2016-04-01 – 2016-04-02 (×2): 21 mg via TRANSDERMAL
  Filled 2016-04-01 (×2): qty 1

## 2016-04-01 NOTE — Progress Notes (Signed)
PROGRESS NOTE    Damon Smith  UUV:[id number] DOB: 08-Mar-1965 DOA: 03/31/2016 PCP: Harvie Junior, MD     Brief Narrative:  51 yo male with cad presents with left lower abdominal pain, severe in intensity, associated with hematuria. On the initial evaluation, patient in severe pain, required IV hydromorphone, CT with left hydronephrosis related to large renal mass. Admitted for pain control and urology consultation.    Assessment & Plan:   Principal Problem:   Renal mass Active Problems:   Chronic pain   Presence of DES in RCA : Synergy 4.0 mm x 28 mm (4.5 mm)   Essential hypertension   Nicotine abuse   Hematuria   Hydronephrosis of left kidney   1. Left renal mass with hydronephrosis. Will continue supportive care, urology consultation with recommendations to stop clopidogrel for eventual surgery. Possible thrombus in the left main ureter. Will follow on renal function and electrolytes. Will increase hydrocodone acetaminophen to 2 tablets. Will change hydromorphone to IV morphine as needed. Add antiacid therapy. Continue tamsulosin.   2. AKI.Suspected pre-renal. Noted unilateral hydronephrosis, will continue saline at 100 cc/hr and follow on renal panel in am, avoid hypotension or nephrotoxic medications.   3. CAD. Will continue asa, but hold on plavix. No chest pain. Stent placed on RCA in 2016.   4. HTN. Continue blood pressure monitoring, continue IV fluids. Continue metoprolol bid.   5. Depression. Continue duloxetine   6. Gastritis. Will continue ppi bid and will add GI cocktail x1. As needed zofran.   6. Chronic left hip pain.    DVT prophylaxis: scd  Code Status: full  Family Communication: No family at the bedside  Disposition Plan: home   Consultants:   Urology   Procedures:     Antimicrobials:      Subjective: Patient with positive left flank pain, worse with movement and improved with pain medications, associated with epigastric pain, no  nausea or vomiting. Chronic right hip pain, on hydrocodone at home.   Objective: Vitals:   03/31/16 2142 04/01/16 0512 04/01/16 0959 04/01/16 1439  BP: (!) 142/95 (!) 148/98 (!) 150/90 124/87  Pulse: 100 94 92 84  Resp: 18 16  16   Temp: 98.9 F (37.2 C) 98.8 F (37.1 C)  98 F (36.7 C)  TempSrc: Oral Oral  Oral  SpO2: 99% 97%  98%  Weight:      Height:        Intake/Output Summary (Last 24 hours) at 04/01/16 1453 Last data filed at 04/01/16 0600  Gross per 24 hour  Intake          1008.33 ml  Output                0 ml  Net          1008.33 ml   Filed Weights   03/31/16 1843  Weight: 85.5 kg (188 lb 6.4 oz)    Examination:  General exam: deconditioned and ill looking appearing E ENT: mild pallor, oral mucosa dry. No icterus Respiratory system: Clear to auscultation. Respiratory effort normal. No wheezing, rales or rhonchi.  Cardiovascular system: S1 & S2 heard, RRR. No JVD, murmurs, rubs, gallops or clicks. No pedal edema. Gastrointestinal system: Abdomen is nondistended, soft and nontender. No organomegaly or masses felt. Normal bowel sounds heard. Central nervous system: Alert and oriented. No focal neurological deficits. Extremities: Symmetric 5 x 5 power. Skin: No rashes, lesions or ulcers      Data Reviewed: I have personally  reviewed following labs and imaging studies  CBC:  Recent Labs Lab 03/31/16 1520 04/01/16 0533  WBC 20.4* 16.0*  HGB 15.3 13.1  HCT 43.7 38.5*  MCV 88.1 90.4  PLT 254 465   Basic Metabolic Panel:  Recent Labs Lab 03/31/16 1520 04/01/16 0533  NA 138 136  K 4.0 4.0  CL 108 105  CO2 20* 25  GLUCOSE 129* 104*  BUN 17 19  CREATININE 1.13 1.34*  CALCIUM 9.6 8.7*   GFR: Estimated Creatinine Clearance: 69.6 mL/min (by C-G formula based on SCr of 1.34 mg/dL (H)). Liver Function Tests:  Recent Labs Lab 03/31/16 1520  AST 24  ALT 29  ALKPHOS 68  BILITOT 1.1  PROT 8.2*  ALBUMIN 5.0    Recent Labs Lab  03/31/16 1520  LIPASE 20   No results for input(s): AMMONIA in the last 168 hours. Coagulation Profile:  Recent Labs Lab 03/31/16 1520  INR 0.95   Cardiac Enzymes: No results for input(s): CKTOTAL, CKMB, CKMBINDEX, TROPONINI in the last 168 hours. BNP (last 3 results) No results for input(s): PROBNP in the last 8760 hours. HbA1C: No results for input(s): HGBA1C in the last 72 hours. CBG: No results for input(s): GLUCAP in the last 168 hours. Lipid Profile: No results for input(s): CHOL, HDL, LDLCALC, TRIG, CHOLHDL, LDLDIRECT in the last 72 hours. Thyroid Function Tests: No results for input(s): TSH, T4TOTAL, FREET4, T3FREE, THYROIDAB in the last 72 hours. Anemia Panel: No results for input(s): VITAMINB12, FOLATE, FERRITIN, TIBC, IRON, RETICCTPCT in the last 72 hours. Sepsis Labs: No results for input(s): PROCALCITON, LATICACIDVEN in the last 168 hours.  No results found for this or any previous visit (from the past 240 hour(s)).       Radiology Studies: Ct Chest W Contrast  Result Date: 04/01/2016 CLINICAL DATA:  Left renal mass. EXAM: CT CHEST, ABDOMEN, AND PELVIS WITH CONTRAST TECHNIQUE: Multidetector CT imaging of the chest, abdomen and pelvis was performed following the standard protocol during bolus administration of intravenous contrast. CONTRAST:  127mL ISOVUE-300 IOPAMIDOL (ISOVUE-300) INJECTION 61%, 10mL ISOVUE-300 IOPAMIDOL (ISOVUE-300) INJECTION 61% COMPARISON:  CT KUB 03/31/2016 FINDINGS: CT CHEST FINDINGS Cardiovascular: Non aneurysmal aorta. Atherosclerotic vascular calcifications. Coronary artery calcifications. Normal heart size. No pericardial effusion. Mediastinum/Nodes: No significantly enlarged mediastinal or hilar nodes. Normal thyroid gland. Trachea midline. Esophagus within normal limits. Lungs/Pleura: There are no pulmonary nodules visualized. There is no acute infiltrate or pleural effusion. Mild right posterior nodular pleural thickening.  Musculoskeletal: No acute or suspicious bone lesion. CT ABDOMEN PELVIS FINDINGS Hepatobiliary: No focal liver abnormality is seen. No gallstones, gallbladder wall thickening, or biliary dilatation. Pancreas: Unremarkable. No pancreatic ductal dilatation or surrounding inflammatory changes. Spleen: Normal in size without focal abnormality. Adrenals/Urinary Tract: Adrenal glands are within normal limits. The right kidney is unremarkable. Enlarged left kidney. Moderate left perinephric fat stranding. Cystic and solid enhancing mass in the lower pole of left kidney with internal calcifications. This measures approximately 9.8 by 6.8 by 7.4 cm. Left renal vein is patent. There is delayed excretion of contrast into the left renal collecting system. There is moderate hydronephrosis. There is high density material within the dilated renal collecting system and proximal left ureter. Stomach/Bowel: Stomach is within normal limits. Appendix appears normal. No evidence of bowel wall thickening, distention, or inflammatory changes. Vascular/Lymphatic: Subcentimeter retroperitoneal lymph nodes. No grossly enlarged abdominal lymph nodes. Reproductive: Prostate is unremarkable. Other: No free air or free fluid. Musculoskeletal: No acute osseous abnormality. Marked arthritis of left hip with diffuse  sclerosis in the left acetabulum and left femoral head. IMPRESSION: 1. No CT evidence for pulmonary nodule. Clear lung fields. No adenopathy in the chest. 2. 9.8 cm cystic and solid suspicious enhancing mass in the left kidney. Patent left renal vein. Moderate left hydronephrosis suspected to be secondary to high density material/possible thrombus within the proximal left ureter and left renal collecting system. There is delayed excretion of contrast into the left renal collecting system. 3. Marked arthritis of left hip. There is diffuse sclerosis in the left acetabulum and iliac bone suspected to be secondary to arthritis of the left  hip. Electronically Signed   By: Donavan Foil M.D.   On: 04/01/2016 00:47   Ct Abdomen Pelvis W Contrast  Result Date: 04/01/2016 CLINICAL DATA:  Left renal mass. EXAM: CT CHEST, ABDOMEN, AND PELVIS WITH CONTRAST TECHNIQUE: Multidetector CT imaging of the chest, abdomen and pelvis was performed following the standard protocol during bolus administration of intravenous contrast. CONTRAST:  183mL ISOVUE-300 IOPAMIDOL (ISOVUE-300) INJECTION 61%, 34mL ISOVUE-300 IOPAMIDOL (ISOVUE-300) INJECTION 61% COMPARISON:  CT KUB 03/31/2016 FINDINGS: CT CHEST FINDINGS Cardiovascular: Non aneurysmal aorta. Atherosclerotic vascular calcifications. Coronary artery calcifications. Normal heart size. No pericardial effusion. Mediastinum/Nodes: No significantly enlarged mediastinal or hilar nodes. Normal thyroid gland. Trachea midline. Esophagus within normal limits. Lungs/Pleura: There are no pulmonary nodules visualized. There is no acute infiltrate or pleural effusion. Mild right posterior nodular pleural thickening. Musculoskeletal: No acute or suspicious bone lesion. CT ABDOMEN PELVIS FINDINGS Hepatobiliary: No focal liver abnormality is seen. No gallstones, gallbladder wall thickening, or biliary dilatation. Pancreas: Unremarkable. No pancreatic ductal dilatation or surrounding inflammatory changes. Spleen: Normal in size without focal abnormality. Adrenals/Urinary Tract: Adrenal glands are within normal limits. The right kidney is unremarkable. Enlarged left kidney. Moderate left perinephric fat stranding. Cystic and solid enhancing mass in the lower pole of left kidney with internal calcifications. This measures approximately 9.8 by 6.8 by 7.4 cm. Left renal vein is patent. There is delayed excretion of contrast into the left renal collecting system. There is moderate hydronephrosis. There is high density material within the dilated renal collecting system and proximal left ureter. Stomach/Bowel: Stomach is within normal  limits. Appendix appears normal. No evidence of bowel wall thickening, distention, or inflammatory changes. Vascular/Lymphatic: Subcentimeter retroperitoneal lymph nodes. No grossly enlarged abdominal lymph nodes. Reproductive: Prostate is unremarkable. Other: No free air or free fluid. Musculoskeletal: No acute osseous abnormality. Marked arthritis of left hip with diffuse sclerosis in the left acetabulum and left femoral head. IMPRESSION: 1. No CT evidence for pulmonary nodule. Clear lung fields. No adenopathy in the chest. 2. 9.8 cm cystic and solid suspicious enhancing mass in the left kidney. Patent left renal vein. Moderate left hydronephrosis suspected to be secondary to high density material/possible thrombus within the proximal left ureter and left renal collecting system. There is delayed excretion of contrast into the left renal collecting system. 3. Marked arthritis of left hip. There is diffuse sclerosis in the left acetabulum and iliac bone suspected to be secondary to arthritis of the left hip. Electronically Signed   By: Donavan Foil M.D.   On: 04/01/2016 00:47   Ct Renal Stone Study  Result Date: 03/31/2016 CLINICAL DATA:  Left flank pain EXAM: CT ABDOMEN AND PELVIS WITHOUT CONTRAST TECHNIQUE: Multidetector CT imaging of the abdomen and pelvis was performed following the standard protocol without IV contrast. COMPARISON:  None. FINDINGS: Lower chest: Lung bases demonstrate no acute consolidation or pleural effusion. Small distal esophageal hiatal hernia. Coronary artery  calcifications. Hepatobiliary: No focal liver abnormality is seen. No gallstones, gallbladder wall thickening, or biliary dilatation. Pancreas: Unremarkable. No pancreatic ductal dilatation or surrounding inflammatory changes. Spleen: Normal in size without focal abnormality. Adrenals/Urinary Tract: The adrenal glands are within normal limits. The right kidney shows no calcified stones, hydronephrosis, or ureteral stone.  Markedly abnormal appearance of left kidney which is enlarged. There is moderate left perinephric fat stranding. There is a heterogenous mass within the lower pole of the left kidney which contains internal calcifications and hyperdensity. This measures approximately 8.7 by 6.5 by 7.9 cm. There is hyperdense material extending into the renal collecting system. There is moderate hydronephrosis with high density material present in the proximal left ureter. The bladder is grossly unremarkable. Stomach/Bowel: Stomach is within normal limits. Appendix appears normal. No evidence of bowel wall thickening, distention, or inflammatory changes. Vascular/Lymphatic: Aortic atherosclerosis. No enlarged abdominal or pelvic lymph nodes. Reproductive: Prostate is unremarkable. Other: No free air or free fluid Musculoskeletal: No acute or suspicious bone lesion. IMPRESSION: 1. Enlarged left kidney with moderate left perinephric fat stranding. There is a a 8.7 cm heterogenous mass containing calcifications within the lower pole of left kidney which is suspicious for neoplasm. There is moderate hydronephrosis of the left kidney ; this is suspected to be secondary to high density material within the proximal left ureter which may represent blood clot. There is additional hemorrhage or thrombus within the dilated left renal collecting system. Further evaluation with contrast enhanced CT or MRI would be helpful. 2. Right kidney is negative for nephrolithiasis, hydronephrosis or ureteral stone. 3. Small esophageal hiatal hernia Electronically Signed   By: Donavan Foil M.D.   On: 03/31/2016 16:32        Scheduled Meds: . atorvastatin  80 mg Oral q1800  . DULoxetine  30 mg Oral Daily  . gabapentin  300 mg Oral QHS  . gi cocktail  30 mL Oral Once  . metoprolol tartrate  37.5 mg Oral BID  . nicotine  21 mg Transdermal Daily  . pantoprazole  40 mg Oral BID  . tamsulosin  0.4 mg Oral Daily   Continuous Infusions: . sodium  chloride 1,000 mL (03/31/16 1545)  . sodium chloride 100 mL/hr at 04/01/16 0615     LOS: 1 day     Tawni Millers, MD [facility] Pager [phone]  If 7PM-7AM, please contact night-coverage [email or web address] Password [facility] 04/01/2016, 2:53 PM

## 2016-04-01 NOTE — Progress Notes (Signed)
MD given update Pt has urinated twice recently blood tinged urine and with each urination small clot noted. Maintain current plan of care and monitor closely

## 2016-04-01 NOTE — Progress Notes (Signed)
Pt becoming increasingly anxious throughout the night. Pt states that pain medication is "not working." Pt went for a walk in the hall and is seen by staff attempting to push the button on the elevator. Pt reports that he wants to go outside for a cigarette. Pt redirected back to his room. NP on call paged and PRN medication ordered and administered. Nicotine patch applied.

## 2016-04-01 NOTE — Progress Notes (Addendum)
Initial Nutrition Assessment  DOCUMENTATION CODES:   Not applicable  INTERVENTION:   Ensure Enlive po BID, each supplement provides 350 kcal and 20 grams of protein  NUTRITION DIAGNOSIS:   Inadequate oral intake related to acute illness as evidenced by meal completion < 50%.  GOAL:   Patient will meet greater than or equal to 90% of their needs  MONITOR:   PO intake, Supplement acceptance, Weight trends, Labs  REASON FOR ASSESSMENT:   Malnutrition Screening Tool    ASSESSMENT:   51 y.o. 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 He presents with left lower abdominal pain starting this AM.  Found to have a heterogenous mass within the lower pole of the left kidney and hydronephrosis. Mass most likely stage 3+ renal cell carcinoma   Met with pt in room today. Pt reports good appetite and oral intake pta. Pt currently eating 50% meals. Pt reports that he usually eats a biscuit at breakfast, skips lunch, and then has a good supper that he cooks at home. Per chart, pt has lost 10lbs(5%) in 4 months; this is not significant. Plan is for patient to have a nephrectomy in the next few weeks. RD discussed the importance of adequate protein intake with pt and discussed the importance of fruit and vegetable intake in order to get the vitamins and minerals that is needed for good health. RD explained to patient that he needs to be well nourished prior to having surgery. Pt likes Ensure, RD will order.    Medications reviewed and include: maalox, nicotine, protonix, hydrocodone, hydromorphine, zofran  Labs reviewed: creat 1.34(H), Ca 8.7(L) Wbc- 16(H)  Nutrition-Focused physical exam completed. Findings are no fat depletion, no muscle depletion, and no edema.   Diet Order:  Diet Heart Room service appropriate? Yes; Fluid consistency: Thin  Skin:  Reviewed, no issues  Last  BM:  3/5  Height:   Ht Readings from Last 1 Encounters:  03/31/16 '5\' 10"'$  (1.778 m)    Weight:   Wt Readings from Last 1 Encounters:  03/31/16 188 lb 6.4 oz (85.5 kg)    Ideal Body Weight:  75.4 kg  BMI:  Body mass index is 27.03 kg/m.  Estimated Nutritional Needs:   Kcal:  2000-2300kcal/day   Protein:  94-111g/day   Fluid:  >2L/day   EDUCATION NEEDS:   No education needs identified at this time  Koleen Distance, RD, LDN Pager #[phone]

## 2016-04-01 NOTE — Progress Notes (Signed)
Subjective:  1 - Large Left Renal Mass - 10cm+ left lower pole renal mass with heterogeneous coarse calcifications by non-con CT 03/2016 on eval flank pain and hematuria. Dedicated contrast CT chest/abd/pelvis 03/2016 withtou any locally advanced or distant disease. Appears 2 artery (lower accessory) / 1 vein left renovascular anatomy. He lost his common law wife to metastatic GYN cancer few mos ago.   2 -  Gross Hematuria - new gross hematuria on / off x few mos 03/2016. 60PY smoker, still smokes. CT with large left renal mass as per above. Cysto pending. He is on ASA + Plavix for h/o CAD/Stent 2016.   Today "Damon Smith" is stable. Most pain in left hip which is chronic. Hematuria improving. CT last PM very favorable in that disease appears resectable and non-metastatic.   Objective: Vital signs in last 24 hours: Temp:  [98 F (36.7 C)-98.9 F (37.2 C)] 98.8 F (37.1 C) (03/14 0512) Pulse Rate:  [94-111] 94 (03/14 0512) Resp:  [16-18] 16 (03/14 0512) BP: (119-148)/(76-99) 148/98 (03/14 0512) SpO2:  [95 %-99 %] 97 % (03/14 0512) Weight:  [85.5 kg (188 lb 6.4 oz)] 85.5 kg (188 lb 6.4 oz) (03/13 1843) Last BM Date: 03/30/16  Intake/Output from previous day: 03/13 0701 - 03/14 0700 In: 1008.3 [I.V.:1008.3] Out: -  Intake/Output this shift: No intake/output data recorded.  General appearance: alert, cooperative and appears stated age Eyes: negative Nose: Nares normal. Septum midline. Mucosa normal. No drainage or sinus tenderness. Throat: lips, mucosa, and tongue normal; teeth and gums normal Neck: supple, symmetrical, trachea midline Back: symmetric, no curvature. ROM normal. No CVA tenderness. Resp: non-labored.  Cardio: Nl rate GI: soft, non-tender; bowel sounds normal; no masses,  no organomegaly Male genitalia: normal Extremities: extremities normal, atraumatic, no cyanosis or edema Pulses: 2+ and symmetric Lymph nodes: Cervical, supraclavicular, and axillary nodes  normal. Neurologic: Grossly normal Incision/Wound: none  Lab Results:   Recent Labs  03/31/16 1520 04/01/16 0533  WBC 20.4* 16.0*  HGB 15.3 13.1  HCT 43.7 38.5*  PLT 254 197   BMET  Recent Labs  03/31/16 1520 04/01/16 0533  NA 138 136  K 4.0 4.0  CL 108 105  CO2 20* 25  GLUCOSE 129* 104*  BUN 17 19  CREATININE 1.13 1.34*  CALCIUM 9.6 8.7*   PT/INR  Recent Labs  03/31/16 1520  LABPROT 12.6  INR 0.95   ABG No results for input(s): PHART, HCO3 in the last 72 hours.  Invalid input(s): PCO2, PO2  Studies/Results: Ct Chest W Contrast  Result Date: 04/01/2016 CLINICAL DATA:  Left renal mass. EXAM: CT CHEST, ABDOMEN, AND PELVIS WITH CONTRAST TECHNIQUE: Multidetector CT imaging of the chest, abdomen and pelvis was performed following the standard protocol during bolus administration of intravenous contrast. CONTRAST:  140mL ISOVUE-300 IOPAMIDOL (ISOVUE-300) INJECTION 61%, 2mL ISOVUE-300 IOPAMIDOL (ISOVUE-300) INJECTION 61% COMPARISON:  CT KUB 03/31/2016 FINDINGS: CT CHEST FINDINGS Cardiovascular: Non aneurysmal aorta. Atherosclerotic vascular calcifications. Coronary artery calcifications. Normal heart size. No pericardial effusion. Mediastinum/Nodes: No significantly enlarged mediastinal or hilar nodes. Normal thyroid gland. Trachea midline. Esophagus within normal limits. Lungs/Pleura: There are no pulmonary nodules visualized. There is no acute infiltrate or pleural effusion. Mild right posterior nodular pleural thickening. Musculoskeletal: No acute or suspicious bone lesion. CT ABDOMEN PELVIS FINDINGS Hepatobiliary: No focal liver abnormality is seen. No gallstones, gallbladder wall thickening, or biliary dilatation. Pancreas: Unremarkable. No pancreatic ductal dilatation or surrounding inflammatory changes. Spleen: Normal in size without focal abnormality. Adrenals/Urinary Tract: Adrenal glands are within  normal limits. The right kidney is unremarkable. Enlarged left  kidney. Moderate left perinephric fat stranding. Cystic and solid enhancing mass in the lower pole of left kidney with internal calcifications. This measures approximately 9.8 by 6.8 by 7.4 cm. Left renal vein is patent. There is delayed excretion of contrast into the left renal collecting system. There is moderate hydronephrosis. There is high density material within the dilated renal collecting system and proximal left ureter. Stomach/Bowel: Stomach is within normal limits. Appendix appears normal. No evidence of bowel wall thickening, distention, or inflammatory changes. Vascular/Lymphatic: Subcentimeter retroperitoneal lymph nodes. No grossly enlarged abdominal lymph nodes. Reproductive: Prostate is unremarkable. Other: No free air or free fluid. Musculoskeletal: No acute osseous abnormality. Marked arthritis of left hip with diffuse sclerosis in the left acetabulum and left femoral head. IMPRESSION: 1. No CT evidence for pulmonary nodule. Clear lung fields. No adenopathy in the chest. 2. 9.8 cm cystic and solid suspicious enhancing mass in the left kidney. Patent left renal vein. Moderate left hydronephrosis suspected to be secondary to high density material/possible thrombus within the proximal left ureter and left renal collecting system. There is delayed excretion of contrast into the left renal collecting system. 3. Marked arthritis of left hip. There is diffuse sclerosis in the left acetabulum and iliac bone suspected to be secondary to arthritis of the left hip. Electronically Signed   By: Donavan Foil M.D.   On: 04/01/2016 00:47   Ct Abdomen Pelvis W Contrast  Result Date: 04/01/2016 CLINICAL DATA:  Left renal mass. EXAM: CT CHEST, ABDOMEN, AND PELVIS WITH CONTRAST TECHNIQUE: Multidetector CT imaging of the chest, abdomen and pelvis was performed following the standard protocol during bolus administration of intravenous contrast. CONTRAST:  191mL ISOVUE-300 IOPAMIDOL (ISOVUE-300) INJECTION 61%, 28mL  ISOVUE-300 IOPAMIDOL (ISOVUE-300) INJECTION 61% COMPARISON:  CT KUB 03/31/2016 FINDINGS: CT CHEST FINDINGS Cardiovascular: Non aneurysmal aorta. Atherosclerotic vascular calcifications. Coronary artery calcifications. Normal heart size. No pericardial effusion. Mediastinum/Nodes: No significantly enlarged mediastinal or hilar nodes. Normal thyroid gland. Trachea midline. Esophagus within normal limits. Lungs/Pleura: There are no pulmonary nodules visualized. There is no acute infiltrate or pleural effusion. Mild right posterior nodular pleural thickening. Musculoskeletal: No acute or suspicious bone lesion. CT ABDOMEN PELVIS FINDINGS Hepatobiliary: No focal liver abnormality is seen. No gallstones, gallbladder wall thickening, or biliary dilatation. Pancreas: Unremarkable. No pancreatic ductal dilatation or surrounding inflammatory changes. Spleen: Normal in size without focal abnormality. Adrenals/Urinary Tract: Adrenal glands are within normal limits. The right kidney is unremarkable. Enlarged left kidney. Moderate left perinephric fat stranding. Cystic and solid enhancing mass in the lower pole of left kidney with internal calcifications. This measures approximately 9.8 by 6.8 by 7.4 cm. Left renal vein is patent. There is delayed excretion of contrast into the left renal collecting system. There is moderate hydronephrosis. There is high density material within the dilated renal collecting system and proximal left ureter. Stomach/Bowel: Stomach is within normal limits. Appendix appears normal. No evidence of bowel wall thickening, distention, or inflammatory changes. Vascular/Lymphatic: Subcentimeter retroperitoneal lymph nodes. No grossly enlarged abdominal lymph nodes. Reproductive: Prostate is unremarkable. Other: No free air or free fluid. Musculoskeletal: No acute osseous abnormality. Marked arthritis of left hip with diffuse sclerosis in the left acetabulum and left femoral head. IMPRESSION: 1. No CT  evidence for pulmonary nodule. Clear lung fields. No adenopathy in the chest. 2. 9.8 cm cystic and solid suspicious enhancing mass in the left kidney. Patent left renal vein. Moderate left hydronephrosis suspected to be secondary to high  density material/possible thrombus within the proximal left ureter and left renal collecting system. There is delayed excretion of contrast into the left renal collecting system. 3. Marked arthritis of left hip. There is diffuse sclerosis in the left acetabulum and iliac bone suspected to be secondary to arthritis of the left hip. Electronically Signed   By: Donavan Foil M.D.   On: 04/01/2016 00:47   Ct Renal Stone Study  Result Date: 03/31/2016 CLINICAL DATA:  Left flank pain EXAM: CT ABDOMEN AND PELVIS WITHOUT CONTRAST TECHNIQUE: Multidetector CT imaging of the abdomen and pelvis was performed following the standard protocol without IV contrast. COMPARISON:  None. FINDINGS: Lower chest: Lung bases demonstrate no acute consolidation or pleural effusion. Small distal esophageal hiatal hernia. Coronary artery calcifications. Hepatobiliary: No focal liver abnormality is seen. No gallstones, gallbladder wall thickening, or biliary dilatation. Pancreas: Unremarkable. No pancreatic ductal dilatation or surrounding inflammatory changes. Spleen: Normal in size without focal abnormality. Adrenals/Urinary Tract: The adrenal glands are within normal limits. The right kidney shows no calcified stones, hydronephrosis, or ureteral stone. Markedly abnormal appearance of left kidney which is enlarged. There is moderate left perinephric fat stranding. There is a heterogenous mass within the lower pole of the left kidney which contains internal calcifications and hyperdensity. This measures approximately 8.7 by 6.5 by 7.9 cm. There is hyperdense material extending into the renal collecting system. There is moderate hydronephrosis with high density material present in the proximal left ureter.  The bladder is grossly unremarkable. Stomach/Bowel: Stomach is within normal limits. Appendix appears normal. No evidence of bowel wall thickening, distention, or inflammatory changes. Vascular/Lymphatic: Aortic atherosclerosis. No enlarged abdominal or pelvic lymph nodes. Reproductive: Prostate is unremarkable. Other: No free air or free fluid Musculoskeletal: No acute or suspicious bone lesion. IMPRESSION: 1. Enlarged left kidney with moderate left perinephric fat stranding. There is a a 8.7 cm heterogenous mass containing calcifications within the lower pole of left kidney which is suspicious for neoplasm. There is moderate hydronephrosis of the left kidney ; this is suspected to be secondary to high density material within the proximal left ureter which may represent blood clot. There is additional hemorrhage or thrombus within the dilated left renal collecting system. Further evaluation with contrast enhanced CT or MRI would be helpful. 2. Right kidney is negative for nephrolithiasis, hydronephrosis or ureteral stone. 3. Small esophageal hiatal hernia Electronically Signed   By: Donavan Foil M.D.   On: 03/31/2016 16:32    Anti-infectives: Anti-infectives    None      Assessment/Plan:  1 - Large Left Renal Mass - we will arrange for left radical nephrectomy in elective setting next avail (likely in few weeks).   2 -  Gross Hematuria - likely from left mass as per above. No clot retention. I again feel best risk / balance at this point in hold plavix, continue ASA. We will hold both immediately prior to nephrectomy.  I feel Pt ok for DC from GU perspective at anytime. He will likely require higher dose pain meds for home. We will arrange f/u and surgery likely within next few weeks.   Please call me directly with questions.   [facility], Jannet Calip 04/01/2016

## 2016-04-02 LAB — CBC WITH DIFFERENTIAL/PLATELET
Basophils Absolute: 0 10*3/uL (ref 0.0–0.1)
Basophils Relative: 0 %
Eosinophils Absolute: 0.1 10*3/uL (ref 0.0–0.7)
Eosinophils Relative: 1 %
HCT: 38.1 % — ABNORMAL LOW (ref 39.0–52.0)
Hemoglobin: 12.5 g/dL — ABNORMAL LOW (ref 13.0–17.0)
Lymphocytes Relative: 29 %
Lymphs Abs: 3.5 10*3/uL (ref 0.7–4.0)
MCH: 29.6 pg (ref 26.0–34.0)
MCHC: 32.8 g/dL (ref 30.0–36.0)
MCV: 90.3 fL (ref 78.0–100.0)
Monocytes Absolute: 1 10*3/uL (ref 0.1–1.0)
Monocytes Relative: 8 %
Neutro Abs: 7.4 10*3/uL (ref 1.7–7.7)
Neutrophils Relative %: 62 %
Platelets: 208 10*3/uL (ref 150–400)
RBC: 4.22 MIL/uL (ref 4.22–5.81)
RDW: 12.8 % (ref 11.5–15.5)
WBC: 12 10*3/uL — ABNORMAL HIGH (ref 4.0–10.5)

## 2016-04-02 LAB — BASIC METABOLIC PANEL WITH GFR
Anion gap: 7 (ref 5–15)
BUN: 14 mg/dL (ref 6–20)
CO2: 25 mmol/L (ref 22–32)
Calcium: 8.9 mg/dL (ref 8.9–10.3)
Chloride: 106 mmol/L (ref 101–111)
Creatinine, Ser: 1.04 mg/dL (ref 0.61–1.24)
GFR calc Af Amer: >60 mL/min
GFR calc non Af Amer: >60 mL/min
Glucose, Bld: 100 mg/dL — ABNORMAL HIGH (ref 65–99)
Potassium: 4 mmol/L (ref 3.5–5.1)
Sodium: 138 mmol/L (ref 135–145)

## 2016-04-02 LAB — URINE CULTURE: Culture: <10000 — AB

## 2016-04-02 MED ORDER — HYDROCODONE-ACETAMINOPHEN 10-325 MG PO TABS: 1.0000 | ORAL_TABLET | Freq: Four times a day (QID) | ORAL | 0 refills | Status: DC | PRN

## 2016-04-02 MED ORDER — PANTOPRAZOLE SODIUM 40 MG PO TBEC: 40.0000 mg | DELAYED_RELEASE_TABLET | Freq: Every day | ORAL | 0 refills | Status: DC

## 2016-04-02 MED ORDER — IBUPROFEN 400 MG PO TABS: 400.0000 mg | ORAL_TABLET | Freq: Four times a day (QID) | ORAL | 0 refills | Status: DC | PRN

## 2016-04-02 MED ORDER — TAMSULOSIN HCL 0.4 MG PO CAPS: 0.4000 mg | ORAL_CAPSULE | Freq: Every day | ORAL | 0 refills | Status: DC

## 2016-04-02 NOTE — Discharge Summary (Signed)
Physician Discharge Summary  Damon Smith GYK:[id number] DOB: [birth year] DOA: 03/31/2016  PCP: Harvie Junior, MD  Admit date: 03/31/2016 Discharge date: 04/02/2016  Admitted From: Home  Disposition:  Home   Recommendations for Outpatient Follow-up:  1. Follow up with PCP in 1- week 2. Follow up with Urology in 7 days.  Home Health: No  Equipment/Devices: No   Discharge Condition: Stable  CODE STATUS: Full  Diet recommendation: Heart Healthy   Brief/Interim Summary: This is a 51 year old gentleman who presented to the hospital with the chief complaint of left lower abdominal pain. The day of admission he developed sudden left flank pain, associated with vomiting and dark urine. On initial physical examination he was in extreme pain, blood pressure 119/76, heart rate 106, respiratory rate 17, temperature 98, his mucous membranes were moist, his lungs were clear to auscultation, heart S1-S2 present rhythmic, abdomen was tender in the left lower quadrant. No lower extremity edema. Sodium 138, potassium 4.0, chloride 108, bicarbonate 20, glucose 129, BUN 17, creatinine 1.13, white count 20.4, Hemoccult 15.3, hematocrit 43.7, platelets 254. His urinalysis had 6-30 white cells, negative nitrates, too numerous to count RBC. CT scan show 8.7 cm heterogeneous mass containing calcification within the lower lobe of the left kidney, associated with moderate hydronephrosis, high density material within the proximal left ureter, hemorrhagic or thrombus within the left dilated left renal collecting system. Right kidney without nephrolithiasis, hydronephrosis or ureteral stone.   The patient was admitted to the hospital working diagnosis of abdominal pain due to left renal mass complicated by left hydronephrosis, hematuria and  thrombosis in the collecting system on the left.  1. Left renal mass, suspicious for malignancy. Patient was admitted to the medical floor, he was placed on maintenance IV  fluids and intravenous hydromorphone for pain control, further workup with a CT of the abdomen and pelvis showed an 9.8 cm cystic solid suspicious enhancing mass in the left kidney, with moderate hydronephrosis related to a suspected thrombus within the proximal left ureter and left renal collecting system. CT chest was negative for nodules. Urology was consulted, recommendations to stop clopidogrel, patient will need radical left nephrectomy, that will be scheduled as an outpatient. For pain control patient will be discharged on hydrocodone and ibuprofen, to take as needed. Patient was placed on Flomax 0.4 mg daily.  2. Acute kidney injury. Prerenal, creatinine 1.34, discharged creatinine 1.0, patient tolerated well IV fluids. Currently patient tolerated well by mouth.  3. Coronary artery disease. Patient has remained chest pain-free, he will continue statin and metoprolol, his cardiac stent was placed in 2016 in the RCA, will stop clopidogrel and continue aspirin due to hematuria.  4. Hypertension. Patient was continued on metoprolol and he will resume lisinopril by the time of discharge  5. Depression. Patient was continued on duloxetine  6. Gastritis. Patient received antiacid therapy in the hospital, he will be discharged on Protonix daily.  7. Chronic left hip pain. Continue pain control as per out patient regimen.     Discharge Diagnoses:  Principal Problem:   Renal mass Active Problems:   Chronic pain   Presence of DES in RCA : Synergy 4.0 mm x 28 mm (4.5 mm)   Essential hypertension   Nicotine abuse   Hematuria   Hydronephrosis of left kidney    Discharge Instructions   Allergies as of 04/02/2016   No Known Allergies     Medication List    STOP taking these medications   clopidogrel 75 MG  tablet Commonly known as:  PLAVIX     TAKE these medications   aspirin 81 MG EC tablet Take 1 tablet (81 mg total) by mouth daily.   atorvastatin 80 MG tablet Commonly known  as:  LIPITOR Take 1 tablet (80 mg total) by mouth daily at 6 PM.   DULoxetine 30 MG capsule Commonly known as:  CYMBALTA Take 30 mg by mouth daily.   gabapentin 300 MG capsule Commonly known as:  NEURONTIN Take 300 mg by mouth 3 (three) times daily. Reported on 01/28/2015   HYDROcodone-acetaminophen 10-325 MG tablet Commonly known as:  NORCO Take 1 tablet by mouth every 6 (six) hours as needed for severe pain. What changed:  Another medication with the same name was removed. Continue taking this medication, and follow the directions you see here.   ibuprofen 400 MG tablet Commonly known as:  ADVIL,MOTRIN Take 1 tablet (400 mg total) by mouth every 6 (six) hours as needed for moderate pain.   lisinopril 5 MG tablet Commonly known as:  PRINIVIL,ZESTRIL Take 1 tablet (5 mg total) by mouth daily.   LORazepam 0.5 MG tablet Commonly known as:  ATIVAN Take 0.1 mg by mouth every 8 (eight) hours as needed for anxiety.   metoprolol tartrate 25 MG tablet Commonly known as:  LOPRESSOR Take 1.5 tablets (37.5 mg total) by mouth 2 (two) times daily.   nitroGLYCERIN 0.4 MG SL tablet Commonly known as:  NITROSTAT Place 1 tablet (0.4 mg total) under the tongue every 5 (five) minutes x 3 doses as needed for chest pain.   pantoprazole 40 MG tablet Commonly known as:  PROTONIX Take 1 tablet (40 mg total) by mouth daily.   tamsulosin 0.4 MG Caps capsule Commonly known as:  FLOMAX Take 1 capsule (0.4 mg total) by mouth daily. Start taking on:  04/03/2016   tiZANidine 4 MG tablet Commonly known as:  ZANAFLEX Take 4 mg by mouth every 8 (eight) hours as needed for muscle spasms. Reported on 01/28/2015   traMADol 50 MG tablet Commonly known as:  ULTRAM Take 1 tablet (50 mg total) by mouth every 6 (six) hours as needed. What changed:  reasons to take this      Follow-up Information    Harvie Junior, MD Follow up.   Specialty:  Family Medicine Contact information: [place] [address] [phone]        Alexis Frock, MD Follow up in 1 week(s).   Specialty:  Urology Contact information: [place] [address] [phone]          No Known Allergies  Consultations:  Urology    Procedures/Studies: Ct Chest W Contrast  Result Date: 04/01/2016 CLINICAL DATA:  Left renal mass. EXAM: CT CHEST, ABDOMEN, AND PELVIS WITH CONTRAST TECHNIQUE: Multidetector CT imaging of the chest, abdomen and pelvis was performed following the standard protocol during bolus administration of intravenous contrast. CONTRAST:  186mL ISOVUE-300 IOPAMIDOL (ISOVUE-300) INJECTION 61%, 50mL ISOVUE-300 IOPAMIDOL (ISOVUE-300) INJECTION 61% COMPARISON:  CT KUB 03/31/2016 FINDINGS: CT CHEST FINDINGS Cardiovascular: Non aneurysmal aorta. Atherosclerotic vascular calcifications. Coronary artery calcifications. Normal heart size. No pericardial effusion. Mediastinum/Nodes: No significantly enlarged mediastinal or hilar nodes. Normal thyroid gland. Trachea midline. Esophagus within normal limits. Lungs/Pleura: There are no pulmonary nodules visualized. There is no acute infiltrate or pleural effusion. Mild right posterior nodular pleural thickening. Musculoskeletal: No acute or suspicious bone lesion. CT ABDOMEN PELVIS FINDINGS Hepatobiliary: No focal liver abnormality is seen. No gallstones, gallbladder  wall thickening, or biliary dilatation. Pancreas: Unremarkable. No pancreatic ductal dilatation or surrounding inflammatory changes. Spleen: Normal in size without focal abnormality. Adrenals/Urinary Tract: Adrenal glands are within normal limits. The right kidney is unremarkable. Enlarged left kidney. Moderate left perinephric fat stranding. Cystic and solid enhancing mass in the lower pole of left kidney with internal calcifications. This measures approximately 9.8 by 6.8 by 7.4 cm. Left renal vein is patent. There is delayed excretion of contrast into the left renal  collecting system. There is moderate hydronephrosis. There is high density material within the dilated renal collecting system and proximal left ureter. Stomach/Bowel: Stomach is within normal limits. Appendix appears normal. No evidence of bowel wall thickening, distention, or inflammatory changes. Vascular/Lymphatic: Subcentimeter retroperitoneal lymph nodes. No grossly enlarged abdominal lymph nodes. Reproductive: Prostate is unremarkable. Other: No free air or free fluid. Musculoskeletal: No acute osseous abnormality. Marked arthritis of left hip with diffuse sclerosis in the left acetabulum and left femoral head. IMPRESSION: 1. No CT evidence for pulmonary nodule. Clear lung fields. No adenopathy in the chest. 2. 9.8 cm cystic and solid suspicious enhancing mass in the left kidney. Patent left renal vein. Moderate left hydronephrosis suspected to be secondary to high density material/possible thrombus within the proximal left ureter and left renal collecting system. There is delayed excretion of contrast into the left renal collecting system. 3. Marked arthritis of left hip. There is diffuse sclerosis in the left acetabulum and iliac bone suspected to be secondary to arthritis of the left hip. Electronically Signed   By: Donavan Foil M.D.   On: 04/01/2016 00:47   Ct Abdomen Pelvis W Contrast  Result Date: 04/01/2016 CLINICAL DATA:  Left renal mass. EXAM: CT CHEST, ABDOMEN, AND PELVIS WITH CONTRAST TECHNIQUE: Multidetector CT imaging of the chest, abdomen and pelvis was performed following the standard protocol during bolus administration of intravenous contrast. CONTRAST:  136mL ISOVUE-300 IOPAMIDOL (ISOVUE-300) INJECTION 61%, 45mL ISOVUE-300 IOPAMIDOL (ISOVUE-300) INJECTION 61% COMPARISON:  CT KUB 03/31/2016 FINDINGS: CT CHEST FINDINGS Cardiovascular: Non aneurysmal aorta. Atherosclerotic vascular calcifications. Coronary artery calcifications. Normal heart size. No pericardial effusion.  Mediastinum/Nodes: No significantly enlarged mediastinal or hilar nodes. Normal thyroid gland. Trachea midline. Esophagus within normal limits. Lungs/Pleura: There are no pulmonary nodules visualized. There is no acute infiltrate or pleural effusion. Mild right posterior nodular pleural thickening. Musculoskeletal: No acute or suspicious bone lesion. CT ABDOMEN PELVIS FINDINGS Hepatobiliary: No focal liver abnormality is seen. No gallstones, gallbladder wall thickening, or biliary dilatation. Pancreas: Unremarkable. No pancreatic ductal dilatation or surrounding inflammatory changes. Spleen: Normal in size without focal abnormality. Adrenals/Urinary Tract: Adrenal glands are within normal limits. The right kidney is unremarkable. Enlarged left kidney. Moderate left perinephric fat stranding. Cystic and solid enhancing mass in the lower pole of left kidney with internal calcifications. This measures approximately 9.8 by 6.8 by 7.4 cm. Left renal vein is patent. There is delayed excretion of contrast into the left renal collecting system. There is moderate hydronephrosis. There is high density material within the dilated renal collecting system and proximal left ureter. Stomach/Bowel: Stomach is within normal limits. Appendix appears normal. No evidence of bowel wall thickening, distention, or inflammatory changes. Vascular/Lymphatic: Subcentimeter retroperitoneal lymph nodes. No grossly enlarged abdominal lymph nodes. Reproductive: Prostate is unremarkable. Other: No free air or free fluid. Musculoskeletal: No acute osseous abnormality. Marked arthritis of left hip with diffuse sclerosis in the left acetabulum and left femoral head. IMPRESSION: 1. No CT evidence for pulmonary nodule. Clear lung fields. No adenopathy in the  chest. 2. 9.8 cm cystic and solid suspicious enhancing mass in the left kidney. Patent left renal vein. Moderate left hydronephrosis suspected to be secondary to high density material/possible  thrombus within the proximal left ureter and left renal collecting system. There is delayed excretion of contrast into the left renal collecting system. 3. Marked arthritis of left hip. There is diffuse sclerosis in the left acetabulum and iliac bone suspected to be secondary to arthritis of the left hip. Electronically Signed   By: Donavan Foil M.D.   On: 04/01/2016 00:47   Ct Renal Stone Study  Result Date: 03/31/2016 CLINICAL DATA:  Left flank pain EXAM: CT ABDOMEN AND PELVIS WITHOUT CONTRAST TECHNIQUE: Multidetector CT imaging of the abdomen and pelvis was performed following the standard protocol without IV contrast. COMPARISON:  None. FINDINGS: Lower chest: Lung bases demonstrate no acute consolidation or pleural effusion. Small distal esophageal hiatal hernia. Coronary artery calcifications. Hepatobiliary: No focal liver abnormality is seen. No gallstones, gallbladder wall thickening, or biliary dilatation. Pancreas: Unremarkable. No pancreatic ductal dilatation or surrounding inflammatory changes. Spleen: Normal in size without focal abnormality. Adrenals/Urinary Tract: The adrenal glands are within normal limits. The right kidney shows no calcified stones, hydronephrosis, or ureteral stone. Markedly abnormal appearance of left kidney which is enlarged. There is moderate left perinephric fat stranding. There is a heterogenous mass within the lower pole of the left kidney which contains internal calcifications and hyperdensity. This measures approximately 8.7 by 6.5 by 7.9 cm. There is hyperdense material extending into the renal collecting system. There is moderate hydronephrosis with high density material present in the proximal left ureter. The bladder is grossly unremarkable. Stomach/Bowel: Stomach is within normal limits. Appendix appears normal. No evidence of bowel wall thickening, distention, or inflammatory changes. Vascular/Lymphatic: Aortic atherosclerosis. No enlarged abdominal or pelvic  lymph nodes. Reproductive: Prostate is unremarkable. Other: No free air or free fluid Musculoskeletal: No acute or suspicious bone lesion. IMPRESSION: 1. Enlarged left kidney with moderate left perinephric fat stranding. There is a a 8.7 cm heterogenous mass containing calcifications within the lower pole of left kidney which is suspicious for neoplasm. There is moderate hydronephrosis of the left kidney ; this is suspected to be secondary to high density material within the proximal left ureter which may represent blood clot. There is additional hemorrhage or thrombus within the dilated left renal collecting system. Further evaluation with contrast enhanced CT or MRI would be helpful. 2. Right kidney is negative for nephrolithiasis, hydronephrosis or ureteral stone. 3. Small esophageal hiatal hernia Electronically Signed   By: Donavan Foil M.D.   On: 03/31/2016 16:32        Subjective: Patient's pain is improved, no nausea or vomiting. Noted blood clots in the urine last night. Abdominal pain.   Discharge Exam: Vitals:   04/01/16 2047 04/02/16 0557  BP: 131/78 131/80  Pulse: 92 81  Resp: 17 18  Temp: 97.9 F (36.6 C) 98.7 F (37.1 C)   Vitals:   04/01/16 0959 04/01/16 1439 04/01/16 2047 04/02/16 0557  BP: (!) 150/90 124/87 131/78 131/80  Pulse: 92 84 92 81  Resp:  16 17 18   Temp:  98 F (36.7 C) 97.9 F (36.6 C) 98.7 F (37.1 C)  TempSrc:  Oral Oral Oral  SpO2:  98% 97% 96%  Weight:      Height:        General: Pt is alert, awake, not in acute distress Cardiovascular: RRR, S1/S2 +, no rubs, no gallops Respiratory: CTA bilaterally,  no wheezing, no rhonchi Abdominal: Soft, NT, ND, bowel sounds + Extremities: no edema, no cyanosis    The results of significant diagnostics from this hospitalization (including imaging, microbiology, ancillary and laboratory) are listed below for reference.     Microbiology: Recent Results (from the past 240 hour(s))  Urine culture      Status: Abnormal   Collection Time: 03/31/16  1:47 PM  Result Value Ref Range Status   Specimen Description URINE, RANDOM  Final   Special Requests NONE  Final   Culture (A)  Final    <10,000 COLONIES/mL INSIGNIFICANT GROWTH Performed at [facility], [address], [place], [place] [address]    Report Status 04/02/2016 FINAL  Final     Labs: BNP (last 3 results) No results for input(s): BNP in the last 8760 hours. Basic Metabolic Panel:  Recent Labs Lab 03/31/16 1520 04/01/16 0533 04/02/16 0520  NA 138 136 138  K 4.0 4.0 4.0  CL 108 105 106  CO2 20* 25 25  GLUCOSE 129* 104* 100*  BUN 17 19 14   CREATININE 1.13 1.34* 1.04  CALCIUM 9.6 8.7* 8.9   Liver Function Tests:  Recent Labs Lab 03/31/16 1520  AST 24  ALT 29  ALKPHOS 68  BILITOT 1.1  PROT 8.2*  ALBUMIN 5.0    Recent Labs Lab 03/31/16 1520  LIPASE 20   No results for input(s): AMMONIA in the last 168 hours. CBC:  Recent Labs Lab 03/31/16 1520 04/01/16 0533 04/02/16 0520  WBC 20.4* 16.0* 12.0*  NEUTROABS  --   --  7.4  HGB 15.3 13.1 12.5*  HCT 43.7 38.5* 38.1*  MCV 88.1 90.4 90.3  PLT 254 197 208   Cardiac Enzymes: No results for input(s): CKTOTAL, CKMB, CKMBINDEX, TROPONINI in the last 168 hours. BNP: Invalid input(s): POCBNP CBG: No results for input(s): GLUCAP in the last 168 hours. D-Dimer No results for input(s): DDIMER in the last 72 hours. Hgb A1c No results for input(s): HGBA1C in the last 72 hours. Lipid Profile No results for input(s): CHOL, HDL, LDLCALC, TRIG, CHOLHDL, LDLDIRECT in the last 72 hours. Thyroid function studies No results for input(s): TSH, T4TOTAL, T3FREE, THYROIDAB in the last 72 hours.  Invalid input(s): FREET3 Anemia work up No results for input(s): VITAMINB12, FOLATE, FERRITIN, TIBC, IRON, RETICCTPCT in the last 72 hours. Urinalysis    Component Value Date/Time   COLORURINE YELLOW 03/31/2016 1347   APPEARANCEUR CLOUDY (A) 03/31/2016 1347    LABSPEC 1.020 03/31/2016 1347   PHURINE 5.0 03/31/2016 1347   GLUCOSEU NEGATIVE 03/31/2016 1347   HGBUR LARGE (A) 03/31/2016 1347   BILIRUBINUR NEGATIVE 03/31/2016 1347   KETONESUR 5 (A) 03/31/2016 1347   PROTEINUR NEGATIVE 03/31/2016 1347   NITRITE NEGATIVE 03/31/2016 1347   LEUKOCYTESUR NEGATIVE 03/31/2016 1347   Sepsis Labs Invalid input(s): PROCALCITONIN,  WBC,  LACTICIDVEN Microbiology Recent Results (from the past 240 hour(s))  Urine culture     Status: Abnormal   Collection Time: 03/31/16  1:47 PM  Result Value Ref Range Status   Specimen Description URINE, RANDOM  Final   Special Requests NONE  Final   Culture (A)  Final    <10,000 COLONIES/mL INSIGNIFICANT GROWTH Performed at [facility], [place] [address], [place], [place] [address]    Report Status 04/02/2016 FINAL  Final     Time coordinating discharge: 45 minutes  SIGNED:   Tawni Millers, MD  [facility] 04/02/2016, 9:58 AM Pager   If 7PM-7AM,  please contact night-coverage [email or web address] Password [email or web address]

## 2016-04-03 ENCOUNTER — Other Ambulatory Visit: Payer: Self-pay | Admitting: Urology

## 2016-05-12 ENCOUNTER — Inpatient Hospital Stay (HOSPITAL_COMMUNITY): Admission: RE | Admit: 2016-05-12 | Payer: Self-pay | Source: Ambulatory Visit

## 2016-05-15 ENCOUNTER — Encounter (HOSPITAL_COMMUNITY): Admission: RE | Payer: Self-pay | Source: Ambulatory Visit

## 2016-05-15 ENCOUNTER — Inpatient Hospital Stay (HOSPITAL_COMMUNITY): Admission: RE | Admit: 2016-05-15 | Payer: Self-pay | Source: Ambulatory Visit | Admitting: Urology

## 2016-05-15 SURGERY — NEPHRECTOMY, RADICAL, ROBOT-ASSISTED, LAPAROSCOPIC, ADULT
Anesthesia: General | Laterality: Left

## 2016-06-12 ENCOUNTER — Ambulatory Visit: Payer: Self-pay | Admitting: Interventional Cardiology

## 2016-06-19 ENCOUNTER — Encounter: Payer: Self-pay | Admitting: Interventional Cardiology

## 2016-06-29 ENCOUNTER — Ambulatory Visit (INDEPENDENT_AMBULATORY_CARE_PROVIDER_SITE_OTHER): Payer: Self-pay | Admitting: Cardiology

## 2016-06-29 ENCOUNTER — Encounter: Payer: Self-pay | Admitting: Cardiology

## 2016-06-29 VITALS — BP 112/80 | HR 88 | Ht 70.0 in | Wt 183.8 lb

## 2016-06-29 DIAGNOSIS — I2510 Atherosclerotic heart disease of native coronary artery without angina pectoris: Secondary | ICD-10-CM

## 2016-06-29 DIAGNOSIS — I10 Essential (primary) hypertension: Secondary | ICD-10-CM

## 2016-06-29 DIAGNOSIS — Z01818 Encounter for other preprocedural examination: Secondary | ICD-10-CM

## 2016-06-29 DIAGNOSIS — F419 Anxiety disorder, unspecified: Secondary | ICD-10-CM

## 2016-06-29 DIAGNOSIS — Z720 Tobacco use: Secondary | ICD-10-CM

## 2016-06-29 DIAGNOSIS — N2889 Other specified disorders of kidney and ureter: Secondary | ICD-10-CM

## 2016-06-29 DIAGNOSIS — E782 Mixed hyperlipidemia: Secondary | ICD-10-CM

## 2016-06-29 NOTE — [facility] Notes (Signed)
Cardiology Office Note   Date:  06/29/2016   ID:  Damon Smith, DOB 05-18-1965, MRN [id number]  PCP:  Damon Junior, MD  Cardiologist:  Dr. Tamala Smith     Chief Complaint  Patient presents with  . Pre-op Exam      History of Present Illness: Damon Smith is a 51 y.o. male who presents for pre-op exam.    He has a hx of  CAD with RCA stents during STEMI of inf wall 09/2014.  Also hx and continued tobacco use, depression, and difficulty sleeping.  On last cath 09/2014 he had 45% LM stenosis, and 50% mid LAD to distal LAD , 1st diag  70% stenosed,  EF 45-50% and Echo the next day with EF 55-60%  PA pk pressure 33 mmHg.   Recent hospitalization with back pain and CT of the abdomen and pelvis showed an 9.8 cm cystic solid suspicious enhancing mass in the left kidney, with moderate hydronephrosis related to a suspected thrombus within the proximal left ureter and left renal collecting system. CT chest was negative for nodules.  Plavix was stopped, and pt needs radical Lt nephrectomy .  Pt had no chest pain.    Today no chest pain and no changes in SOB.  He is active at times with construction and has had no chest pain.  His pain from renal mass on lt seems to cause diaphragmatic pain.   He was seen in ER at [facility] for this pain and felt to be his mass.     + tobacco continues at 1.5 PPD.  He does not believe he can stop.  He has a lot of stress and recent death of his girlfriend in January 19, 2023.  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  His mother will help her while he is hospitalized and recovers.  He continues with hip pain.  This does limit some activity and he is on chronic pain meds for this.   He continues on BB, statin and ASA.  BP controlled.       Past Medical History:  Diagnosis Date  . Chronic pain 10/16/2014   On Narcotics @ home.  . Coronary artery disease involving native heart with unstable angina pectoris ([place]) 10/17/2014   LM ~45%, mRCA  99% (DES PCI), RPL3 70%, mLAD 50% & ostial D1 70%, OM1 75%  . Essential hypertension 10/17/2014  . Hyperlipidemia with target LDL less than 70 10/16/2014  . Presence of DES in RCA : Synergy 4.0 mm x 28 mm (4.5 mm) 10/17/2014  . ST elevation myocardial infarction (STEMI) of inferior wall, initial episode of care ([place]) 10/16/2014  . Tobacco abuse 10/16/2014    Past Surgical History:  Procedure Laterality Date  . CARDIAC CATHETERIZATION N/A 10/16/2014   Procedure: Left Heart Cath and Coronary Angiography;  Surgeon: Belva Crome, MD;  Location: [place] [facility];  Service: Cardiovascular;  LM ~45%, mRCA 99% (DES PCI), RPL3 70%, mLAD 50% & ostial D1 70%, OM1 75%  . CARDIAC CATHETERIZATION N/A 10/16/2014   Procedure: Coronary Stent Intervention;  Surgeon: Belva Crome, MD;  Location: [place] [facility];  Service: Cardiovascular;  mRCA 99% --> Synergy DES 4.0 mm x 28 mm (4.5 mm)  . knee pain    . NO PAST SURGERIES    . TRANSTHORACIC ECHOCARDIOGRAM       Current Outpatient Prescriptions  Medication Sig Dispense Refill  . aspirin EC 81 MG EC tablet Take  1 tablet (81 mg total) by mouth daily.    Marland Kitchen atorvastatin (LIPITOR) 80 MG tablet Take 1 tablet (80 mg total) by mouth daily at 6 PM. 90 tablet 2  . DULoxetine (CYMBALTA) 30 MG capsule Take 30 mg by mouth daily.    Marland Kitchen HYDROcodone-acetaminophen (NORCO) 10-325 MG tablet Take 1 tablet by mouth every 6 (six) hours as needed for severe pain. 10 tablet 0  . ibuprofen (ADVIL,MOTRIN) 400 MG tablet Take 1 tablet (400 mg total) by mouth every 6 (six) hours as needed for moderate pain. 30 tablet 0  . lisinopril (PRINIVIL,ZESTRIL) 5 MG tablet Take 1 tablet (5 mg total) by mouth daily. 90 tablet 3  . LORazepam (ATIVAN) 0.5 MG tablet Take 0.1 mg by mouth every 8 (eight) hours as needed for anxiety.    . metoprolol tartrate (LOPRESSOR) 25 MG tablet Take 1.5 tablets (37.5 mg total) by mouth 2 (two) times daily. 270 tablet 11  . nitroGLYCERIN (NITROSTAT) 0.4 MG SL  tablet Place 1 tablet (0.4 mg total) under the tongue every 5 (five) minutes x 3 doses as needed for chest pain. 25 tablet 3  . traMADol (ULTRAM) 50 MG tablet Take 1 tablet (50 mg total) by mouth every 6 (six) hours as needed. (Patient taking differently: Take 50 mg by mouth every 6 (six) hours as needed for moderate pain or severe pain. ) 10 tablet 0   No current facility-administered medications for this visit.     Allergies:   Patient has no known allergies.    Social History:  The patient  reports that he has been smoking.  He has been smoking about 2.00 packs per day. He has never used smokeless tobacco. He reports that he does not drink alcohol or use drugs.   Family History:  The patient's family history includes Heart attack (age of onset: 4) in his father; Hyperlipidemia in his father; Hypertension in his father.    ROS:  General:no colds or fevers, + weight decrease not much appetite.  Skin:no rashes or ulcers HEENT:no blurred vision, no congestion CV:see HPI PUL:see HPI GI:no diarrhea constipation or melena, no indigestion GU:no hematuria that he can see, no dysuria MS:no joint pain, no claudication, + Lt hip and back pain. Neuro:no syncope, no lightheadedness Endo:no diabetes, no thyroid disease  Wt Readings from Last 3 Encounters:  06/29/16 183 lb 12.8 oz (83.4 kg)  03/31/16 188 lb 6.4 oz (85.5 kg)  11/21/15 198 lb 12.8 oz (90.2 kg)     PHYSICAL EXAM: VS:  BP 112/80   Pulse 88   Ht 5\' 10"  (1.778 m)   Wt 183 lb 12.8 oz (83.4 kg)   SpO2 98%   BMI 26.37 kg/m  , BMI Body mass index is 26.37 kg/m. General:Pleasant affect, NAD Skin:Warm and dry, brisk capillary refill HEENT:normocephalic, sclera clear, mucus membranes moist Neck:supple, no JVD, no bruits  Heart:S1S2 RRR without murmur, gallup, rub or click Lungs:clear without rales, rhonchi, or wheezes JOI:NOMV, non tender, + BS, do not palpate liver spleen or masses Ext:no lower ext edema, 2+ pedal pulses, 2+  radial pulses Neuro:alert and oriented, MAE, follows commands, + facial symmetry    EKG:  EKG is ordered today. The ekg ordered today demonstrates  SR LAD and no changes from previous EKGs.     Recent Labs: 03/31/2016: ALT 29 04/02/2016: BUN 14; Creatinine, Ser 1.04; Hemoglobin 12.5; Platelets 208; Potassium 4.0; Sodium 138    Lipid Panel    Component Value Date/Time   CHOL  150 06/20/2015 1154   TRIG 165 (H) 06/20/2015 1154   HDL 39 (L) 06/20/2015 1154   CHOLHDL 3.8 06/20/2015 1154   VLDL 33 (H) 06/20/2015 1154   LDLCALC 78 06/20/2015 1154       Other studies Reviewed: Additional studies/ records that were reviewed today include: . ECHO 09/2014 Study Conclusions  - Left ventricle: The cavity size was normal. Systolic function was   normal. The estimated ejection fraction was in the range of 55%   to 60%. Hypokinesis of the basal-midinferior myocardium. Left   ventricular diastolic function parameters were normal. - Aortic valve: There was trivial regurgitation. - Mitral valve: There was mild regurgitation. - Pulmonary arteries: PA peak pressure: 33 mm Hg (S).   Cardiac Cath 10/15/16 Procedures   Coronary Stent Intervention  Left Heart Cath and Coronary Angiography  Conclusion   1. 1st RPLB lesion, 70% stenosed. 2. 1st Mrg lesion, 75% stenosed. 3. Mid LAD to Dist LAD lesion, 50% stenosed. 4. Ost 1st Diag lesion, 70% stenosed. 5. 1st Diag lesion, 35% stenosed. 6. LM lesion, 45% stenosed. 7. Prox RCA to Dist RCA lesion, 99% stenosed. There is a 0% residual stenosis post intervention. 8. A drug-eluting stent was placed.    Acute inferior wall ST elevation myocardial infarction due to high-grade mid right coronary obstruction with likely distal embolization to the microcirculation.  Successful PTCA and stenting of the mid right coronary from 99% to 0% with TIMI grade 3 flow. Residual 50-70% left ventricular branch obstruction is noted.  Moderate distal left main,  moderate first diagonal stenosis, mild-to-moderate diffuse mid LAD, and moderately severe first obtuse marginal  Low normal left ventricular function with inferoapical hypokinesis. EF 45-50%.   RECOMMENDATIONS:   Aggressive risk factor modification including lipid management and smoking cessation.  Care management assistance with medication needs.  Phase I into cardiac rehabilitation  Discharge 48-72 hours.  Patient is high risk for medication noncompliance.      ASSESSMENT AND PLAN:  1.  Pre-op eval for total Lt nephrectomy for mass to be done by Dr. Brendia Sacks with urology plan for surgery 07/08/16 --in pt with prior inf wall STEMI and stent placed 10/15/16.  Pt without chest pain.  Discussed with Dr. Lovena Le -DOD, EKG is stable and pt needs this procedure.  Low risk for moderate risk surgery.  He will follow up with Dr. Tamala Smith in 2-3 months.    2. CAD with hx of STEMI of inf wall with stent placed to RCA.  He does have residual disease and is on ASA, BB and statin.  No angina  3. HLD stable on last eval.   4. Renal mass.  5.  Lt hip pain with chronic narcotic use, followed by PCP.  6.  Depression and anxiety he is not seeing counselor - denies suicidal thoughts.  7.  HTN controlled.   8. Tobacco use.  Continues at 1.5 PPD and does not believe he can stop.        Current medicines are reviewed with the patient today.  The patient Has no concerns regarding medicines.  The following changes have been made:  See above Labs/ tests ordered today include:see above  Disposition:   FU:  see above  Signed, Cecilie Kicks, NP  06/29/2016 3:24 PM    [address], [place], [place], [place] Phone: [phone]; Fax: [phone]

## 2016-06-29 NOTE — Patient Instructions (Signed)
Medication Instructions:  None  Labwork: None  Testing/Procedures: None  Follow-Up: Your physician recommends that you schedule a follow-up appointment in: 2-3 months with Dr. Tamala Julian.    Any Other Special Instructions Will Be Listed Below (If Applicable).     If you need a refill on your cardiac medications before your next appointment, please call your pharmacy.

## 2016-08-13 ENCOUNTER — Other Ambulatory Visit: Payer: Self-pay | Admitting: Interventional Cardiology

## 2016-09-11 ENCOUNTER — Encounter: Payer: Self-pay | Admitting: Interventional Cardiology

## 2016-10-01 ENCOUNTER — Encounter: Payer: Self-pay | Admitting: Interventional Cardiology

## 2016-10-01 ENCOUNTER — Ambulatory Visit (INDEPENDENT_AMBULATORY_CARE_PROVIDER_SITE_OTHER): Payer: Self-pay | Admitting: Interventional Cardiology

## 2016-10-01 VITALS — BP 120/70 | HR 98 | Ht 70.0 in | Wt 194.8 lb

## 2016-10-01 DIAGNOSIS — I10 Essential (primary) hypertension: Secondary | ICD-10-CM

## 2016-10-01 DIAGNOSIS — E782 Mixed hyperlipidemia: Secondary | ICD-10-CM

## 2016-10-01 DIAGNOSIS — I25110 Atherosclerotic heart disease of native coronary artery with unstable angina pectoris: Secondary | ICD-10-CM

## 2016-10-01 NOTE — [facility] Notes (Signed)
Cardiology Office Note    Date:  10/01/2016   ID:  Damon Smith, DOB 1965-01-27, MRN [id number]  PCP:  Harvie Junior, MD  Cardiologist: Sinclair Grooms, MD   Chief Complaint  Patient presents with  . Coronary Artery Disease    History of Present Illness:  Damon Smith is a 51 y.o. male has a hx of CAD with RCA stents during STEMI of inf wall 09/2014(cath 09/2014 he had 45% LM stenosis, and 50% mid LAD to distal LAD , 1st diag  70% stenosed), heavy tobacco use, depression, chronic diastolic heart failure with EF greater than 50%, and difficulty sleeping.       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 This was done at [facility]. No cardiac complications. Plavix was discontinued for at least 5 days prior to the procedure. He denies chest discomfort, dyspnea, palpitations, claudication, and orthopnea.  Has chronic pain syndrome due to multiple other disease entities.  Still smoking up to 2 packs of cigarettes per day.  Has chronic opiate dependency.  Past Medical History:  Diagnosis Date  . Chronic pain 10/16/2014   On Narcotics @ home.  . Coronary artery disease involving native heart with unstable angina pectoris ([place]) 10/17/2014   LM ~45%, mRCA 99% (DES PCI), RPL3 70%, mLAD 50% & ostial D1 70%, OM1 75%  . Essential hypertension 10/17/2014  . Hyperlipidemia with target LDL less than 70 10/16/2014  . Presence of DES in RCA : Synergy 4.0 mm x 28 mm (4.5 mm) 10/17/2014  . ST elevation myocardial infarction (STEMI) of inferior wall, initial episode of care ([place]) 10/16/2014  . Tobacco abuse 10/16/2014    Past Surgical History:  Procedure Laterality Date  . CARDIAC CATHETERIZATION N/A 10/16/2014   Procedure: Left Heart Cath and Coronary Angiography;  Surgeon: Belva Crome, MD;  Location: [place] [facility];  Service: Cardiovascular;  LM ~45%, mRCA 99% (DES PCI), RPL3 70%, mLAD 50% & ostial D1 70%, OM1 75%  .  CARDIAC CATHETERIZATION N/A 10/16/2014   Procedure: Coronary Stent Intervention;  Surgeon: Belva Crome, MD;  Location: [place] [facility];  Service: Cardiovascular;  mRCA 99% --> Synergy DES 4.0 mm x 28 mm (4.5 mm)  . knee pain    . NO PAST SURGERIES    . TRANSTHORACIC ECHOCARDIOGRAM      Current Medications: Outpatient Medications Prior to Visit  Medication Sig Dispense Refill  . atorvastatin (LIPITOR) 80 MG tablet TAKE ONE TABLET BY MOUTH DAILY AT 6PM 90 tablet 1  . HYDROcodone-acetaminophen (NORCO) 10-325 MG tablet Take 1 tablet by mouth every 6 (six) hours as needed for severe pain. 10 tablet 0  . ibuprofen (ADVIL,MOTRIN) 400 MG tablet Take 1 tablet (400 mg total) by mouth every 6 (six) hours as needed for moderate pain. 30 tablet 0  . lisinopril (PRINIVIL,ZESTRIL) 5 MG tablet Take 1 tablet (5 mg total) by mouth daily. 90 tablet 3  . LORazepam (ATIVAN) 0.5 MG tablet Take 0.1 mg by mouth every 8 (eight) hours as needed for anxiety.    . metoprolol tartrate (LOPRESSOR) 25 MG tablet Take 1.5 tablets (37.5 mg total) by mouth 2 (two) times daily. 270 tablet 11  . nitroGLYCERIN (NITROSTAT) 0.4 MG SL tablet Place 1 tablet (0.4 mg total) under the tongue every 5 (five) minutes x 3 doses as needed for chest pain. 25 tablet 3  . aspirin EC 81 MG EC tablet Take 1 tablet (  81 mg total) by mouth daily.    . DULoxetine (CYMBALTA) 30 MG capsule Take 30 mg by mouth daily.    . traMADol (ULTRAM) 50 MG tablet Take 1 tablet (50 mg total) by mouth every 6 (six) hours as needed. (Patient taking differently: Take 50 mg by mouth every 6 (six) hours as needed for moderate pain or severe pain. ) 10 tablet 0   No facility-administered medications prior to visit.      Allergies:   Patient has no known allergies.   Social History   Social History  . Marital status: Single    Spouse name: N/A  . Number of children: N/A  . Years of education: N/A   Social History Main Topics  . Smoking status: Current Every  Day Smoker    Packs/day: 2.00  . Smokeless tobacco: Never Used     Comment: 2 ppd  . Alcohol use No  . Drug use: No  . Sexual activity: Not Asked   Other Topics Concern  . None   Social History Narrative  . None     Family History:  The patient's family history includes Heart attack (age of onset: 51) in his father; Hyperlipidemia in his father; Hypertension in his father.   ROS:   Please see the history of present illness.    Vision disturbance, leg pain, excessive fatigue, dizziness, anxiety, joint swelling, difficulty with balance, headache, rash, back pain and muscle discomfort. History of depression. Girlfriend died within the past 6 months prior to him being diagnosed with kidney cancer.  All other systems reviewed and are negative.   PHYSICAL EXAM:   VS:  BP 120/70   Pulse 98   Ht 5\' 10"  (1.778 m)   Wt 194 lb 12.8 oz (88.4 kg)   BMI 27.95 kg/m    Spells heavily of tobacco smoke. GEN: Well nourished, well developed, in no acute distress  HEENT: normal  Neck: no JVD, carotid bruits, or masses Cardiac: RRR; no murmurs, rubs, or gallops,no edema  Respiratory:  clear to auscultation bilaterally, normal work of breathing GI: soft, nontender, nondistended, + BS MS: no deformity or atrophy  Skin: warm and dry, no rash Neuro:  Alert and Oriented x 3, Strength and sensation are intact Psych: euthymic mood, full affect  Wt Readings from Last 3 Encounters:  10/01/16 194 lb 12.8 oz (88.4 kg)  06/29/16 183 lb 12.8 oz (83.4 kg)  03/31/16 188 lb 6.4 oz (85.5 kg)      Studies/Labs Reviewed:   EKG:  EKG  Not repeated  Recent Labs: 03/31/2016: ALT 29 04/02/2016: BUN 14; Creatinine, Ser 1.04; Hemoglobin 12.5; Platelets 208; Potassium 4.0; Sodium 138   Lipid Panel    Component Value Date/Time   CHOL 150 06/20/2015 1154   TRIG 165 (H) 06/20/2015 1154   HDL 39 (L) 06/20/2015 1154   CHOLHDL 3.8 06/20/2015 1154   VLDL 33 (H) 06/20/2015 1154   LDLCALC 78 06/20/2015 1154     Additional studies/ records that were reviewed today include:  Most recent total cholesterol was 122 at [facility].    ASSESSMENT:    1. Coronary artery disease involving native coronary artery of native heart with unstable angina pectoris ([place])   2. Essential hypertension   3. Presence of DES in RCA : Synergy 4.0 mm x 28 mm (4.5 mm)   4. Mixed hyperlipidemia      PLAN:  In order of problems listed above:  1. It would be advisable to continue clopidogrel  daily. Okay to drop aspirin. 2. Low-salt diet and continue medical therapy as listed. Target blood pressure 130/90 mmHg a less. 3. Continue atorvastatin 80 mg per day. Recent laboratory data at [facility].  Strongly encouraged to discontinue cigarette smoking. Encouraged aerobic activity. Clinical follow-up in one year. Simplify antiplatelet regimen to Plavix 75 mg per day.    Medication Adjustments/Labs and Tests Ordered: Current medicines are reviewed at length with the patient today.  Concerns regarding medicines are outlined above.  Medication changes, Labs and Tests ordered today are listed in the Patient Instructions below. Patient Instructions  Medication Instructions:  1) STOP Aspirin  Labwork: None  Testing/Procedures: None  Follow-Up: Your physician wants you to follow-up in: 1 year with Dr. Tamala Julian.  You will receive a reminder letter in the mail two months in advance. If you don't receive a letter, please call our office to schedule the follow-up appointment.   Any Other Special Instructions Will Be Listed Below (If Applicable).     If you need a refill on your cardiac medications before your next appointment, please call your pharmacy.      Signed, Sinclair Grooms, MD  10/01/2016 3:12 PM    [facility] [address], [place], [place]  [address] Phone: [phone]; Fax: [phone]

## 2016-10-01 NOTE — Patient Instructions (Addendum)
Medication Instructions:  1) STOP Aspirin  Labwork: None  Testing/Procedures: None  Follow-Up: Your physician wants you to follow-up in: 1 year with Dr. Tamala Julian.  You will receive a reminder letter in the mail two months in advance. If you don't receive a letter, please call our office to schedule the follow-up appointment.   Any Other Special Instructions Will Be Listed Below (If Applicable).     If you need a refill on your cardiac medications before your next appointment, please call your pharmacy.

## 2017-01-02 ENCOUNTER — Other Ambulatory Visit: Payer: Self-pay | Admitting: Cardiology

## 2017-01-04 NOTE — Telephone Encounter (Signed)
REFILL 

## 2017-07-31 ENCOUNTER — Emergency Department (HOSPITAL_COMMUNITY)
Admission: EM | Admit: 2017-07-31 | Discharge: 2017-07-31 | Disposition: A | Discharge status: Home / Self Care | Payer: Medicaid Other | Attending: Emergency Medicine | Admitting: Emergency Medicine

## 2017-07-31 ENCOUNTER — Encounter (HOSPITAL_COMMUNITY): Payer: Self-pay | Admitting: Emergency Medicine

## 2017-07-31 ENCOUNTER — Emergency Department (HOSPITAL_COMMUNITY): Payer: Medicaid Other

## 2017-07-31 ENCOUNTER — Other Ambulatory Visit: Payer: Self-pay

## 2017-07-31 DIAGNOSIS — Z720 Tobacco use: Secondary | ICD-10-CM | POA: Diagnosis present

## 2017-07-31 DIAGNOSIS — Z79899 Other long term (current) drug therapy: Secondary | ICD-10-CM | POA: Diagnosis not present

## 2017-07-31 DIAGNOSIS — K4090 Unilateral inguinal hernia, without obstruction or gangrene, not specified as recurrent: Secondary | ICD-10-CM

## 2017-07-31 DIAGNOSIS — I10 Essential (primary) hypertension: Secondary | ICD-10-CM | POA: Insufficient documentation

## 2017-07-31 DIAGNOSIS — M1990 Unspecified osteoarthritis, unspecified site: Secondary | ICD-10-CM | POA: Insufficient documentation

## 2017-07-31 DIAGNOSIS — F419 Anxiety disorder, unspecified: Secondary | ICD-10-CM

## 2017-07-31 DIAGNOSIS — Z87898 Personal history of other specified conditions: Secondary | ICD-10-CM

## 2017-07-31 DIAGNOSIS — I2510 Atherosclerotic heart disease of native coronary artery without angina pectoris: Secondary | ICD-10-CM | POA: Diagnosis not present

## 2017-07-31 DIAGNOSIS — C642 Malignant neoplasm of left kidney, except renal pelvis: Secondary | ICD-10-CM | POA: Insufficient documentation

## 2017-07-31 DIAGNOSIS — K4190 Unilateral femoral hernia, without obstruction or gangrene, not specified as recurrent: Secondary | ICD-10-CM | POA: Diagnosis not present

## 2017-07-31 DIAGNOSIS — R1031 Right lower quadrant pain: Secondary | ICD-10-CM | POA: Diagnosis present

## 2017-07-31 DIAGNOSIS — F17210 Nicotine dependence, cigarettes, uncomplicated: Secondary | ICD-10-CM | POA: Insufficient documentation

## 2017-07-31 DIAGNOSIS — I252 Old myocardial infarction: Secondary | ICD-10-CM | POA: Diagnosis not present

## 2017-07-31 DIAGNOSIS — F329 Major depressive disorder, single episode, unspecified: Secondary | ICD-10-CM | POA: Diagnosis present

## 2017-07-31 DIAGNOSIS — Z7901 Long term (current) use of anticoagulants: Secondary | ICD-10-CM

## 2017-07-31 DIAGNOSIS — F32A Depression, unspecified: Secondary | ICD-10-CM | POA: Insufficient documentation

## 2017-07-31 DIAGNOSIS — K4130 Unilateral femoral hernia, with obstruction, without gangrene, not specified as recurrent: Secondary | ICD-10-CM | POA: Insufficient documentation

## 2017-07-31 DIAGNOSIS — K429 Umbilical hernia without obstruction or gangrene: Secondary | ICD-10-CM

## 2017-07-31 LAB — CBC WITH DIFFERENTIAL/PLATELET
Basophils Absolute: 0 10*3/uL (ref 0.0–0.1)
Basophils Relative: 0 %
EOS ABS: 0.1 10*3/uL (ref 0.0–0.7)
EOS PCT: 1 %
HCT: 41.2 % (ref 39.0–52.0)
HEMOGLOBIN: 14.1 g/dL (ref 13.0–17.0)
LYMPHS PCT: 28 %
Lymphs Abs: 3 10*3/uL (ref 0.7–4.0)
MCH: 31.4 pg (ref 26.0–34.0)
MCHC: 34.2 g/dL (ref 30.0–36.0)
MCV: 91.8 fL (ref 78.0–100.0)
Monocytes Absolute: 0.7 10*3/uL (ref 0.1–1.0)
Monocytes Relative: 6 %
NEUTROS ABS: 7 10*3/uL (ref 1.7–7.7)
NEUTROS PCT: 65 %
PLATELETS: 259 10*3/uL (ref 150–400)
RBC: 4.49 MIL/uL (ref 4.22–5.81)
RDW: 12.8 % (ref 11.5–15.5)
WBC: 10.7 10*3/uL — AB (ref 4.0–10.5)

## 2017-07-31 LAB — BASIC METABOLIC PANEL
ANION GAP: 9 (ref 5–15)
BUN: 15 mg/dL (ref 6–20)
CALCIUM: 9.2 mg/dL (ref 8.9–10.3)
CO2: 26 mmol/L (ref 22–32)
Chloride: 105 mmol/L (ref 98–111)
Creatinine, Ser: 1.06 mg/dL (ref 0.61–1.24)
Glucose, Bld: 97 mg/dL (ref 70–99)
POTASSIUM: 3.9 mmol/L (ref 3.5–5.1)
Sodium: 140 mmol/L (ref 135–145)

## 2017-07-31 LAB — I-STAT CHEM 8, ED
BUN: 14 mg/dL (ref 6–20)
CALCIUM ION: 1.17 mmol/L (ref 1.15–1.40)
CHLORIDE: 104 mmol/L (ref 98–111)
Creatinine, Ser: 1.1 mg/dL (ref 0.61–1.24)
Glucose, Bld: 93 mg/dL (ref 70–99)
HCT: 40 % (ref 39.0–52.0)
Hemoglobin: 13.6 g/dL (ref 13.0–17.0)
Potassium: 3.8 mmol/L (ref 3.5–5.1)
SODIUM: 141 mmol/L (ref 135–145)
TCO2: 24 mmol/L (ref 22–32)

## 2017-07-31 MED ORDER — GABAPENTIN 300 MG PO CAPS: 300.0000 mg | ORAL_CAPSULE | Freq: Two times a day (BID) | ORAL | Status: DC

## 2017-07-31 MED ORDER — IOPAMIDOL (ISOVUE-300) INJECTION 61%
100.0000 mL | Freq: Once | INTRAVENOUS | Status: AC | PRN
  Administered 2017-07-31: 100 mL via INTRAVENOUS

## 2017-07-31 MED ORDER — ACETAMINOPHEN 500 MG PO TABS: 1000.0000 mg | ORAL_TABLET | Freq: Three times a day (TID) | ORAL | Status: DC

## 2017-07-31 MED ORDER — SODIUM CHLORIDE 0.9 % IV BOLUS
500.0000 mL | Freq: Once | INTRAVENOUS | Status: AC
  Administered 2017-07-31: 500 mL via INTRAVENOUS

## 2017-07-31 MED ORDER — ONDANSETRON HCL 4 MG/2ML IJ SOLN
4.0000 mg | Freq: Once | INTRAMUSCULAR | Status: AC
  Administered 2017-07-31: 4 mg via INTRAVENOUS
  Filled 2017-07-31: qty 2

## 2017-07-31 MED ORDER — HYDROMORPHONE HCL 1 MG/ML IJ SOLN
0.5000 mg | Freq: Once | INTRAMUSCULAR | Status: AC
  Administered 2017-07-31: 0.5 mg via INTRAVENOUS
  Filled 2017-07-31: qty 1

## 2017-07-31 MED ORDER — IOPAMIDOL (ISOVUE-300) INJECTION 61%
INTRAVENOUS | Status: AC
  Filled 2017-07-31: qty 100

## 2017-07-31 MED ORDER — HYDROMORPHONE HCL 1 MG/ML IJ SOLN
1.0000 mg | Freq: Once | INTRAMUSCULAR | Status: AC
  Administered 2017-07-31: 1 mg via INTRAVENOUS
  Filled 2017-07-31: qty 1

## 2017-07-31 NOTE — ED Provider Notes (Signed)
[facility] DEPT Provider Note   CSN: [id number] Arrival date & time: 07/31/17  0435     History   Chief Complaint Chief Complaint  Patient presents with  . Abdominal Pain    HPI Damon Smith is a 52 y.o. male with a past medical history of CAD, hypertension, hyperlipidemia, history of STEMI status post DES in the RCA, who presents the emergency department for chief complaint of right groin pain.  Patient states that he has had a long-standing sensation of fullness, pressure and pain in the right inguinal region.  He states that he has had CT scans in the past and has always been told that it has been a "fatty deposit."  2 days ago he noticed sudden onset of increasing moderate to severe pain, increasing size of the mass and severe tenderness to palpation.  Patient states that he "tried to tough it out at home for the past 2 days."  He rates the pain as severe, uncontrolled with his home narcotic medications, radiating through his pelvis and into his back.  He denies urinary symptoms, flank pain, nausea, vomiting, chest pain, shortness of breath, fevers, chills.  HPI  Past Medical History:  Diagnosis Date  . Chronic pain 10/16/2014   On Narcotics @ home.  . Coronary artery disease involving native heart with unstable angina pectoris ([place]) 10/17/2014   LM ~45%, mRCA 99% (DES PCI), RPL3 70%, mLAD 50% & ostial D1 70%, OM1 75%  . Essential hypertension 10/17/2014  . Hyperlipidemia with target LDL less than 70 10/16/2014  . Presence of DES in RCA : Synergy 4.0 mm x 28 mm (4.5 mm) 10/17/2014  . ST elevation myocardial infarction (STEMI) of inferior wall, initial episode of care ([place]) 10/16/2014  . Tobacco abuse 10/16/2014    Patient Active Problem List   Diagnosis Date Noted  . Incarcerated femoral hernia (fat/lymph node) 07/31/2017  . Left inguinal hernia 07/31/2017  . Arthritis 07/31/2017  . Renal cell carcinoma of left kidney s/p radical nephrectomy  2018 07/31/2017  . History of financial difficulties 07/31/2017  . Chronic anticoagulation 07/31/2017  . Umbilical hernia 65/03/5463  . Hyperlipidemia 06/20/2015  . Sleep disturbance 06/20/2015  . Left hip pain 06/20/2015  . Anxiety and depression 10/23/2014  . Coronary artery disease involving native coronary artery of native heart with angina pectoris ([place]) 10/17/2014  . Presence of DES in RCA : Synergy 4.0 mm x 28 mm (4.5 mm) 10/17/2014  . Essential hypertension 10/17/2014  . Tobacco abuse 10/16/2014  . Chronic pain 10/16/2014    Past Surgical History:  Procedure Laterality Date  . CARDIAC CATHETERIZATION N/A 10/16/2014   Procedure: Left Heart Cath and Coronary Angiography;  Surgeon: Belva Crome, MD;  Location: [facility];  Service: Cardiovascular;  LM ~45%, mRCA 99% (DES PCI), RPL3 70%, mLAD 50% & ostial D1 70%, OM1 75%  . CARDIAC CATHETERIZATION N/A 10/16/2014   Procedure: Coronary Stent Intervention;  Surgeon: Belva Crome, MD;  Location: [facility];  Service: Cardiovascular;  mRCA 99% --> Synergy DES 4.0 mm x 28 mm (4.5 mm)  . knee pain    . NO PAST SURGERIES    . TRANSTHORACIC ECHOCARDIOGRAM          Home Medications    Prior to Admission medications   Medication Sig Start Date End Date Taking? Authorizing Provider  atorvastatin (LIPITOR) 80 MG tablet TAKE ONE TABLET BY MOUTH DAILY AT 6PM 08/13/16  Yes Isaiah Serge,  NP  clopidogrel (PLAVIX) 75 MG tablet TAKE ONE TABLET BY MOUTH DAILY 01/04/17  Yes Isaiah Serge, NP  diazepam (VALIUM) 5 MG tablet Take 5 mg by mouth 2 (two) times daily. 07/14/17  Yes [provider]  gabapentin (NEURONTIN) 300 MG capsule Take 300 mg by mouth 3 (three) times daily. 01/26/17  Yes [provider]  HYDROcodone-acetaminophen (NORCO) 10-325 MG tablet Take 1 tablet by mouth every 6 (six) hours as needed for severe pain. 04/02/16  Yes Arrien, Jimmy Picket, MD  ibuprofen (ADVIL,MOTRIN) 400 MG tablet Take 1  tablet (400 mg total) by mouth every 6 (six) hours as needed for moderate pain. 04/02/16  Yes Arrien, Jimmy Picket, MD  lisinopril (PRINIVIL,ZESTRIL) 10 MG tablet Take 10 mg by mouth daily.   Yes [provider]  metoprolol tartrate (LOPRESSOR) 25 MG tablet TAKE ONE AND ONE-HALF (1 & 1/2) TABLET BY MOUTH TWO TIMES A DAY 01/04/17  Yes Isaiah Serge, NP  Multiple Vitamin (MULTI-VITAMINS) TABS Take 1 tablet by mouth daily.   Yes [provider]  nitroGLYCERIN (NITROSTAT) 0.4 MG SL tablet Place 1 tablet (0.4 mg total) under the tongue every 5 (five) minutes x 3 doses as needed for chest pain. 10/18/14  Yes Almyra Deforest, PA  traMADol (ULTRAM) 50 MG tablet Take 50 mg by mouth every 6 (six) hours as needed.   Yes [provider]  vitamin E 400 UNIT capsule Take 400 Units by mouth daily.   Yes [provider]  lisinopril (PRINIVIL,ZESTRIL) 5 MG tablet Take 1 tablet (5 mg total) by mouth daily. Patient not taking: Reported on 07/31/2017 10/18/14   Almyra Deforest, PA    Family History Family History  Problem Relation Age of Onset  . Heart attack Father 85  . Hyperlipidemia Father   . Hypertension Father     Social History Social History   Tobacco Use  . Smoking status: Current Every Day Smoker    Packs/day: 2.00  . Smokeless tobacco: Never Used  . Tobacco comment: 2 ppd  Substance Use Topics  . Alcohol use: No    Alcohol/week: 0.0 oz  . Drug use: No     Allergies   Patient has no known allergies.   Review of Systems Review of Systems Ten systems reviewed and are negative for acute change, except as noted in the HPI.   Physical Exam Updated Vital Signs BP (!) 130/91 (BP Location: Left Arm)   Pulse 68   Temp 98.3 F (36.8 C) (Oral)   Resp 14   Ht 5\' 10"  (1.778 m)   Wt 82.6 kg (182 lb)   SpO2 99%   BMI 26.11 kg/m   Physical Exam Physical Exam  Nursing note and vitals reviewed. Constitutional: He appears well-developed and well-nourished. No  distress.  HENT:  Head: Normocephalic and atraumatic.  Eyes: Conjunctivae normal are normal. No scleral icterus.  Neck: Normal range of motion. Neck supple.  Cardiovascular: Normal rate, regular rhythm and normal heart sounds.   Pulmonary/Chest: Effort normal and breath sounds normal. No respiratory distress.  Abdominal: Soft.  Firm, exquisitely tender mass in the right inguinal region, not reducible.   Musculoskeletal: He exhibits no edema.  Neurological: He is alert.  Skin: Skin is warm and dry. He is not diaphoretic.  Psychiatric: His behavior is normal.     ED Treatments / Results  Labs (all labs ordered are listed, but only abnormal results are displayed) Labs Reviewed  CBC WITH DIFFERENTIAL/PLATELET - Abnormal; Notable for  the following components:      Result Value   WBC 10.7 (*)    All other components within normal limits  BASIC METABOLIC PANEL  I-STAT CHEM 8, ED    EKG None  Radiology Ct Abdomen Pelvis W Contrast  Result Date: 07/31/2017 CLINICAL DATA:  Right inguinal hernia pain. EXAM: CT ABDOMEN AND PELVIS WITH CONTRAST TECHNIQUE: Multidetector CT imaging of the abdomen and pelvis was performed using the standard protocol following bolus administration of intravenous contrast. CONTRAST:  12mL ISOVUE-300 IOPAMIDOL (ISOVUE-300) INJECTION 61% COMPARISON:  CT abdomen pelvis dated March 31, 2016. FINDINGS: Lower chest: No acute abnormality. Hepatobiliary: No focal liver abnormality is seen. No gallstones, gallbladder wall thickening, or biliary dilatation. Pancreas: Unremarkable. No pancreatic ductal dilatation or surrounding inflammatory changes. Spleen: Normal in size without focal abnormality. Adrenals/Urinary Tract: Adrenal glands are unremarkable. Prior left nephrectomy. Right kidney is normal, without renal calculi, focal lesion, or hydronephrosis. Bladder is unremarkable. Stomach/Bowel: Stomach is within normal limits. Appendix appears normal. No evidence of bowel  wall thickening, distention, or inflammatory changes. Vascular/Lymphatic: Aortic atherosclerosis. No enlarged abdominal or pelvic lymph nodes. Reproductive: Prostate is unremarkable. Other: Moderate fat containing right inguinal hernia containing a small amount of inflammatory fat stranding. Small fat containing left inguinal hernia. No free fluid or pneumoperitoneum. Musculoskeletal: No acute or significant osseous findings. 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 IMPRESSION: 1. Moderate fat containing right inguinal hernia with mild inflammatory changes of the fat. 2. Additional small fat containing left inguinal hernia. 3.  Aortic atherosclerosis (ICD10-I70.0). Electronically Signed   By: Titus Dubin M.D.   On: 07/31/2017 08:15    Procedures Hernia reduction Date/Time: 07/31/2017 1:34 PM Performed by: Margarita Mail, PA-C Authorized by: Margarita Mail, PA-C  Consent given by: patient Patient identity confirmed: verbally with patient and provided demographic data Preparation: Patient was prepped and draped in the usual sterile fashion. Local anesthesia used: no  Anesthesia: Local anesthesia used: no  Sedation: Patient sedated: no  Comments: Patient placed in Trendelenburg.  Ice applied.  I attempted manual reduction of his hernia without success.    (including critical care time)  Medications Ordered in ED Medications  iopamidol (ISOVUE-300) 61 % injection (has no administration in time range)  gabapentin (NEURONTIN) capsule 300 mg (has no administration in time range)  acetaminophen (TYLENOL) tablet 1,000 mg (has no administration in time range)  HYDROmorphone (DILAUDID) injection 0.5 mg (0.5 mg Intravenous Given 07/31/17 0657)  ondansetron (ZOFRAN) injection 4 mg (4 mg Intravenous Given 07/31/17 0656)  sodium chloride 0.9 % bolus 500 mL (0 mLs Intravenous Stopped 07/31/17 0851)  HYDROmorphone (DILAUDID) injection 1 mg (1 mg Intravenous Given 07/31/17 0753)  iopamidol  (ISOVUE-300) 61 % injection 100 mL (100 mLs Intravenous Contrast Given 07/31/17 0734)     Initial Impression / Assessment and Plan / ED Course  I have reviewed the triage vital signs and the nursing notes.  Pertinent labs & imaging results that were available during my care of the patient were reviewed by me and considered in my medical decision making (see chart for details).  Clinical Course as of Aug 01 1334  Sat Jul 31, 2017  2993 Suspect inguinal hernia, ? Bowel vs fat. But consistent with hernia. I have placed the patient in trendelenburg with ice pack. Labs and pain control initiated.   [AH]  1334 Patient with inguinal hernia.  Seen and shared visit with Dr. Roxanne Mins and both he and I attempted reduction of the hernia.  I did discuss the  findings of the CT scan which showed fat-containing hernia with inflammatory stranding.  Her gross of surgery was consulted and came to try and manually reduce the hernia as well but was unable.  The patient is on Plavix and would need cardiac clearance for surgery so will be followed up outpatient.  Patient understands this.  He is currently under pain control and will follow up with his pain management specialist.  He does not appear to have any life-threatening injuries at this time.  No evidence of infection.  No evidence of incarcerated bowel.  Patient will be discharged to follow-up with Dr. gross.   [AH]    Clinical Course User Index [AH] Margarita Mail, PA-C      Final Clinical Impressions(s) / ED Diagnoses   Final diagnoses:  Femoral hernia of right side    ED Discharge Orders    None       Margarita Mail, PA-C 09/38/18 2993    Delora Fuel, MD 71/69/67 [phone]

## 2017-07-31 NOTE — Consult Note (Signed)
Damon Smith  Oct 21, 1965 [id number]  CARE TEAM:  PCP: Josetta Huddle, MD  Outpatient Care Team: Patient Care Team: Josetta Huddle, MD as PCP - General (Internal Medicine) Belva Crome, MD as Consulting Physician (Cardiology) Clent Jacks, MD as Consulting Physician (Urology) Michael Boston, MD as Consulting Physician (General Surgery)  Inpatient Treatment Team: Treatment Team: Attending Provider: Lajean Saver, MD; Physician Assistant: Margarita Mail, PA-C; Technician: Lowry Bowl, NT; Registered Nurse: Lonna Duval, RN; Consulting Physician: Edison Pace Md, MD; Registered Nurse: Ilda Foil, RN   This patient is a 52 y.o.male who presents today for surgical evaluation at the request of Dr Roxanne Mins, [facility] ED.   Chief complaint / Reason for evaluation: Incarcerated inguinal hernia  52 year old smoking male with history of coronary disease requiring stenting in 2017.  Had a left kidney cancer required robotic resection last year at [facility].  Has had a lump in his right groin he thinks for about 3 years.  Became more swollen and tender the past few days.  Was concerned.  Came to emergency room.  CT scan shows probable inguinal hernia on the right.  Smaller on the left.  Small umbilical hernia.  Not able to be reduced.  Surgical consultation requested.  Patient struggles with financial difficulties.  Has a lot of chronic pain and arthritis issues.  Trying to get established through Medicare/Medicaid.  He denies any nausea or vomiting.  No major problems with urination.  No constipation.  Just uncomfortable.  Normally he can walk about 10 to 15 minutes without difficulty.  He is followed by Dr. Daneen Schick with cardiology.  He is fully anticoagulated on Plavix.  Sounds like he got stented in 2017.   Assessment  Damon Smith  52 y.o. male       Problem List:  Principal Problem:   Incarcerated femoral hernia (fat/lymph node) Active Problems:   History of  financial difficulties   Tobacco abuse   Anxiety and depression   Left inguinal hernia   Chronic anticoagulation   Umbilical hernia   Right groin hernia incarcerated.  Seems more femoral to me.  Not truly inguinal.  Perhaps an enlarged lymph node based on exam and CT scan.  At least a partial fat component.  Mostly reduced.  Not causing obstruction or strangulation.  Smaller asymptomatic umbilical and left inguinal hernias.  Plan:  At some point, I think he would benefit from surgery to reduce and repair his hernias.  While he does have a knot in his right groin, it is not inflamed infected or necrotic.  At the most to contain some fat and may be a mildly enlarged lymph node contralateral to where his kidney tumor was.  He recalls being told he has had something there for at least 3 years.  There is no major mention of lymphadenopathy on the pathology from the nephrectomy done at [facility].  No lymphovascular invasion.  I believe he would benefit from cardiac clearance to make sure he can safely come off his Plavix.  I believe it is been more than 12 months since his stenting and he tolerated a radical nephrectomy at [facility] last year.  Wish to be safe though.  Ideally I would do laparoscopic preperitoneal exploration with reduction and mesh reinforcement in both groins.  Umbilical hernia most likely can be primarily repaired but might do diagnostic laparoscopy to rule out any incisional hernias as well given his heavy smoking history.  Right patch all  hernias found.  He was worried about mesh.  I noted there are risks but his risks of infection and complications from the mesh is much higher in emergent setting and if he continues to smoke.  His failure rate with primary repair only will be very high if he continues to smoke.  The anatomy & physiology of the abdominal wall and pelvic floor was discussed.  The pathophysiology of hernias in the inguinal and pelvic region was discussed.   Natural history risks such as progressive enlargement, pain, incarceration, and strangulation was discussed.   Contributors to complications such as smoking, obesity, diabetes, prior surgery, etc were discussed.    I feel the risks of no intervention will lead to serious problems that outweigh the operative risks; therefore, I recommended surgery to reduce and repair the hernia.  I explained laparoscopic techniques with possible need for an open approach.  I noted usual use of mesh to patch and/or buttress hernia repair.  Explained the benefits outweigh the risks of mesh utilization and lowering recurrence and chronic pain.  A challenge in this patient.  Risks such as bleeding, infection, abscess, need for further treatment, injury to other organs, need for repair of tissues / organs, stroke, heart attack, death, and other risks were discussed.  I noted a good likelihood this will help address the problem.   Goals of post-operative recovery were discussed as well.  Possibility that this will not correct all symptoms was explained.  I stressed the importance of low-impact activity, aggressive pain control, avoiding constipation, & not pushing through pain to minimize risk of post-operative chronic pain or injury. Possibility of reherniation was discussed.  We will work to minimize complications.     An educational handout further explaining the pathology & treatment options was given as well.  Questions were answered.  The patient expresses understanding & wishes to proceed with surgery.     It would be ideal to get him to quit smoking.  He notes that will be a challenge for him.  STOP SMOKING! We talked to the patient about the dangers of smoking.  We stressed that tobacco use dramatically increases the risk of peri-operative complications such as infection, tissue necrosis leaving to problems with incision/wound and organ healing, hernia, chronic pain, heart attack, stroke, DVT, pulmonary embolism, and  death.  We noted there are programs in our community to help stop smoking.  Information was available.  He is very stressed about his financial situation has been trying to get Medicare/Medicaid set up.  I asked emergency department to see what resources are available to help facilitate this.  We will try and follow him electively in the office to coordinate out and urgent but elective repair once cleared by cardiology and hopefully quitting smoking.   -VTE prophylaxis- SCDs, etc -mobilize as tolerated to help recovery  45 minutes spent in review, evaluation, examination, counseling, and coordination of care.  More than 50% of that time was spent in counseling.  Adin Hector, MD, FACS, MASCRS Gastrointestinal and Minimally Invasive Surgery    [address], [place], [place] [address] [phone] Main / Paging [phone] Fax   07/31/2017      Past Medical History:  Diagnosis Date  . Chronic pain 10/16/2014   On Narcotics @ home.  . Coronary artery disease involving native heart with unstable angina pectoris ([place]) 10/17/2014   LM ~45%, mRCA 99% (DES PCI), RPL3 70%, mLAD 50% & ostial D1 70%, OM1 75%  .  Essential hypertension 10/17/2014  . Hyperlipidemia with target LDL less than 70 10/16/2014  . Presence of DES in RCA : Synergy 4.0 mm x 28 mm (4.5 mm) 10/17/2014  . ST elevation myocardial infarction (STEMI) of inferior wall, initial episode of care ([place]) 10/16/2014  . Tobacco abuse 10/16/2014    Past Surgical History:  Procedure Laterality Date  . CARDIAC CATHETERIZATION N/A 10/16/2014   Procedure: Left Heart Cath and Coronary Angiography;  Surgeon: Belva Crome, MD;  Location: [facility];  Service: Cardiovascular;  LM ~45%, mRCA 99% (DES PCI), RPL3 70%, mLAD 50% & ostial D1 70%, OM1 75%  . CARDIAC CATHETERIZATION N/A 10/16/2014   Procedure: Coronary Stent Intervention;  Surgeon: Belva Crome, MD;  Location: [facility];  Service:  Cardiovascular;  mRCA 99% --> Synergy DES 4.0 mm x 28 mm (4.5 mm)  . knee pain    . NO PAST SURGERIES    . TRANSTHORACIC ECHOCARDIOGRAM      Social History   Socioeconomic History  . Marital status: Single    Spouse name: Not on file  . Number of children: Not on file  . Years of education: Not on file  . Highest education level: Not on file  Occupational History  . Not on file  Social Needs  . Financial resource strain: Not on file  . Food insecurity:    Worry: Not on file    Inability: Not on file  . Transportation needs:    Medical: Not on file    Non-medical: Not on file  Tobacco Use  . Smoking status: Current Every Day Smoker    Packs/day: 2.00  . Smokeless tobacco: Never Used  . Tobacco comment: 2 ppd  Substance and Sexual Activity  . Alcohol use: No    Alcohol/week: 0.0 oz  . Drug use: No  . Sexual activity: Not on file  Lifestyle  . Physical activity:    Days per week: Not on file    Minutes per session: Not on file  . Stress: Not on file  Relationships  . Social connections:    Talks on phone: Not on file    Gets together: Not on file    Attends religious service: Not on file    Active member of club or organization: Not on file    Attends meetings of clubs or organizations: Not on file    Relationship status: Not on file  . Intimate partner violence:    Fear of current or ex partner: Not on file    Emotionally abused: Not on file    Physically abused: Not on file    Forced sexual activity: Not on file  Other Topics Concern  . Not on file  Social History Narrative  . Not on file    Family History  Problem Relation Age of Onset  . Heart attack Father 102  . Hyperlipidemia Father   . Hypertension Father     Current Facility-Administered Medications  Medication Dose Route Frequency Provider Last Rate Last Dose  . iopamidol (ISOVUE-300) 61 % injection            Current Outpatient Medications  Medication Sig Dispense Refill  . atorvastatin  (LIPITOR) 80 MG tablet TAKE ONE TABLET BY MOUTH DAILY AT 6PM 90 tablet 1  . clopidogrel (PLAVIX) 75 MG tablet TAKE ONE TABLET BY MOUTH DAILY 90 tablet 2  . diazepam (VALIUM) 5 MG tablet Take 5 mg by mouth 2 (two) times daily.  5  . gabapentin (NEURONTIN) 300 MG capsule Take 300 mg by mouth 3 (three) times daily.    Marland Kitchen HYDROcodone-acetaminophen (NORCO) 10-325 MG tablet Take 1 tablet by mouth every 6 (six) hours as needed for severe pain. 10 tablet 0  . ibuprofen (ADVIL,MOTRIN) 400 MG tablet Take 1 tablet (400 mg total) by mouth every 6 (six) hours as needed for moderate pain. 30 tablet 0  . lisinopril (PRINIVIL,ZESTRIL) 10 MG tablet Take 10 mg by mouth daily.    . metoprolol tartrate (LOPRESSOR) 25 MG tablet TAKE ONE AND ONE-HALF (1 & 1/2) TABLET BY MOUTH TWO TIMES A DAY 270 tablet 2  . Multiple Vitamin (MULTI-VITAMINS) TABS Take 1 tablet by mouth daily.    . nitroGLYCERIN (NITROSTAT) 0.4 MG SL tablet Place 1 tablet (0.4 mg total) under the tongue every 5 (five) minutes x 3 doses as needed for chest pain. 25 tablet 3  . traMADol (ULTRAM) 50 MG tablet Take 50 mg by mouth every 6 (six) hours as needed.    . vitamin E 400 UNIT capsule Take 400 Units by mouth daily.    Marland Kitchen lisinopril (PRINIVIL,ZESTRIL) 5 MG tablet Take 1 tablet (5 mg total) by mouth daily. (Patient not taking: Reported on 07/31/2017) 90 tablet 3     No Known Allergies  ROS:   All other systems reviewed & are negative except per HPI or as noted below: Constitutional:  No fevers, chills, sweats.  Weight stable Eyes:  No vision changes, No discharge HENT:  No sore throats, nasal drainage Lymph: No neck swelling, No bruising easily Pulmonary:  No cough, productive sputum CV: No orthopnea, PND  Patient walks 15 minutes slowly given his hip and spine issues.  No exertional chest/neck/shoulder/arm pain. GI: No personal nor family history of GI/colon cancer, inflammatory bowel disease, irritable bowel syndrome, allergy such as Celiac Sprue,  dietary/dairy problems, colitis, ulcers nor gastritis.  No recent sick contacts/gastroenteritis.  No travel outside the country.  No changes in diet. Renal: No UTIs, No hematuria Genital:  No drainage, bleeding, masses Musculoskeletal: No severe joint pain.  Good ROM major joints Skin:  No sores or lesions.  No rashes Heme/Lymph:  No easy bleeding.  No swollen lymph nodes Neuro: No focal weakness/numbness.  No seizures Psych: No suicidal ideation.  No hallucinations  BP 137/81 (BP Location: Left Arm)   Pulse 73   Temp 98.3 F (36.8 C) (Oral)   Resp 17   Ht '5\' 10"'$  (1.778 m)   Wt 82.6 kg (182 lb)   SpO2 99%   BMI 26.11 kg/m   Physical Exam: General: Pt awake/alert/oriented x4 in no major acute distress Eyes: PERRL, normal EOM. Sclera nonicteric Neuro: CN II-XII intact w/o focal sensory/motor deficits. Lymph: No head/neck/groin lymphadenopathy Psych:  No delerium/psychosis/paranoia.  Concerned.  Obvious distress.  Mildly anxious but consolable.   Occasionally interrupts and talkative but pleasant.  Not combative.   HENT: Normocephalic, Mucus membranes moist.  No thrush Neck: Supple, No tracheal deviation Chest: No pain.  Good respiratory excursion. CV:  Pulses intact.  Regular rhythm Abdomen: Soft, Nondistended.  Nontender.  1 cm umbilical hernia through the stalk.  Super umbilical midline incision intact with no obvious hernia.  no incarcerated hernias.  Gen: Obvious mass in the lower right groin.  More inferior than inguinal canal consistent more with a femoral hernia.  A little more firm.  Wonder if this is a lymph node.  Correlates with fat and probable lymph node containing hernia and right pelvis.  Again seems lower than the inguinal canal and I would call this more of a femoral hernia.  No definite right inguinal hernia.  Small impulse on left side consistent with a small left inguinal hernia.  Testes cords and phallus otherwise within normal limits     Ext:  SCDs BLE.  No  significant edema.  No cyanosis Skin: No petechiae / purpurea.  No major sores Musculoskeletal: No severe joint pain.  Good ROM major joints   Results:   Labs: Results for orders placed or performed during the hospital encounter of 07/31/17 (from the past 48 hour(s))  Basic metabolic panel     Status: None   Collection Time: 07/31/17  6:50 AM  Result Value Ref Range   Sodium 140 135 - 145 mmol/L   Potassium 3.9 3.5 - 5.1 mmol/L   Chloride 105 98 - 111 mmol/L    Comment: Please note change in reference range.   CO2 26 22 - 32 mmol/L   Glucose, Bld 97 70 - 99 mg/dL    Comment: Please note change in reference range.   BUN 15 6 - 20 mg/dL    Comment: Please note change in reference range.   Creatinine, Ser 1.06 0.61 - 1.24 mg/dL   Calcium 9.2 8.9 - 10.3 mg/dL   GFR calc non Af Amer >60 >60 mL/min   GFR calc Af Amer >60 >60 mL/min    Comment: (NOTE) The eGFR has been calculated using the CKD EPI equation. This calculation has not been validated in all clinical situations. eGFR's persistently <60 mL/min signify possible Chronic Kidney Disease.    Anion gap 9 5 - 15    Comment: Performed at [facility], [place] [address] [place], [place] [address]  CBC with Differential     Status: Abnormal   Collection Time: 07/31/17  6:50 AM  Result Value Ref Range   WBC 10.7 (H) 4.0 - 10.5 K/uL   RBC 4.49 4.22 - 5.81 MIL/uL   Hemoglobin 14.1 13.0 - 17.0 g/dL   HCT 41.2 39.0 - 52.0 %   MCV 91.8 78.0 - 100.0 fL   MCH 31.4 26.0 - 34.0 pg   MCHC 34.2 30.0 - 36.0 g/dL   RDW 12.8 11.5 - 15.5 %   Platelets 259 150 - 400 K/uL   Neutrophils Relative % 65 %   Neutro Abs 7.0 1.7 - 7.7 K/uL   Lymphocytes Relative 28 %   Lymphs Abs 3.0 0.7 - 4.0 K/uL   Monocytes Relative 6 %   Monocytes Absolute 0.7 0.1 - 1.0 K/uL   Eosinophils Relative 1 %   Eosinophils Absolute 0.1 0.0 - 0.7 K/uL   Basophils Relative 0 %   Basophils Absolute 0.0 0.0 - 0.1 K/uL    Comment: Performed at [facility], [place] [address] [place], [place] [address]  I-stat Chem 8, ED     Status: None   Collection Time: 07/31/17  6:57 AM  Result Value Ref Range   Sodium 141 135 - 145 mmol/L   Potassium 3.8 3.5 - 5.1 mmol/L   Chloride 104 98 - 111 mmol/L   BUN 14 6 - 20 mg/dL   Creatinine, Ser 1.10 0.61 - 1.24 mg/dL   Glucose, Bld 93 70 - 99 mg/dL   Calcium, Ion 1.17 1.15 - 1.40 mmol/L   TCO2 24 22 - 32 mmol/L   Hemoglobin 13.6 13.0 - 17.0 g/dL   HCT 40.0 39.0 - 52.0 %  Imaging / Studies: Ct Abdomen Pelvis W Contrast  Result Date: 07/31/2017 CLINICAL DATA:  Right inguinal hernia pain. EXAM: CT ABDOMEN AND PELVIS WITH CONTRAST TECHNIQUE: Multidetector CT imaging of the abdomen and pelvis was performed using the standard protocol following bolus administration of intravenous contrast. CONTRAST:  175m ISOVUE-300 IOPAMIDOL (ISOVUE-300) INJECTION 61% COMPARISON:  CT abdomen pelvis dated March 31, 2016. FINDINGS: Lower chest: No acute abnormality. Hepatobiliary: No focal liver abnormality is seen. No gallstones, gallbladder wall thickening, or biliary dilatation. Pancreas: Unremarkable. No pancreatic ductal dilatation or surrounding inflammatory changes. Spleen: Normal in size without focal abnormality. Adrenals/Urinary Tract: Adrenal glands are unremarkable. Prior left nephrectomy. Right kidney is normal, without renal calculi, focal lesion, or hydronephrosis. Bladder is unremarkable. Stomach/Bowel: Stomach is within normal limits. Appendix appears normal. No evidence of bowel wall thickening, distention, or inflammatory changes. Vascular/Lymphatic: Aortic atherosclerosis. No enlarged abdominal or pelvic lymph nodes. Reproductive: Prostate is unremarkable. Other: Moderate fat containing right inguinal hernia containing a small amount of inflammatory fat stranding. Small fat containing left inguinal hernia. No free fluid or pneumoperitoneum. Musculoskeletal: No acute or significant osseous  findings. 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 IMPRESSION: 1. Moderate fat containing right inguinal hernia with mild inflammatory changes of the fat. 2. Additional small fat containing left inguinal hernia. 3.  Aortic atherosclerosis (ICD10-I70.0). Electronically Signed   By: WTitus DubinM.D.   On: 07/31/2017 08:15    Medications / Allergies: per chart  Antibiotics: Anti-infectives (From admission, onward)   None        Note: Portions of this report may have been transcribed using voice recognition software. Every effort was made to ensure accuracy; however, inadvertent computerized transcription errors may be present.   Any transcriptional errors that result from this process are unintentional.    SAdin Hector MD, FACS, MASCRS Gastrointestinal and Minimally Invasive Surgery    [address] [place] [phone] / Paging [phone]   07/31/2017

## 2017-07-31 NOTE — ED Notes (Signed)
We spend much time prior to d/c to re-iterate the importance of being seen by his cardiologist Monday or Tuesday; then seeing his surgeon Thurs. Or Fri. Of this upcoming week. He verbalizes understanding.

## 2017-07-31 NOTE — ED Triage Notes (Signed)
Pt arriving with mass on right lower abdominal area that has been present for 3 years but is now causing severe pain. Pt states this is causing him pain that radiates towards his back. Pt had left kidney removed last year. Pt also having leg swelling.

## 2017-07-31 NOTE — ED Notes (Signed)
He is relaxed in appearance.

## 2017-07-31 NOTE — Discharge Instructions (Signed)
Follow-up with surgical office to plan elective repair of hernias in both groins, especially the right groin.  Dr Johney Maine [phone]  Consider quitting smoking.    Need clearance from your cardiologist to make sure it is safe to come off your Plavix anticoagulation around the time of surgery   Inguinal Hernia, Adult An inguinal hernia is when fat or the intestines push through the area where the leg meets the lower abdomen (groin) and create a rounded lump (bulge). This condition develops over time. There are three types of inguinal hernias. These types include:  Hernias that can be pushed back into the belly (are reducible).  Hernias that are not reducible (are incarcerated).  Hernias that are not reducible and lose their blood supply (are strangulated). This type of hernia requires emergency surgery.  What are the causes? This condition is caused by having a weak spot in the muscles or tissue. This weakness lets the hernia poke through. This condition can be triggered by:  Suddenly straining the muscles of the lower abdomen.  Lifting heavy objects.  Straining to have a bowel movement. Difficult bowel movements (constipation) can lead to this.  Coughing.  What increases the risk? This condition is more likely to develop in:  Men.  Pregnant women.  People who: ? Are overweight. ? Work in jobs that require long periods of standing or heavy lifting. ? Have had an inguinal hernia before. ? Smoke or have lung disease. These factors can lead to long-lasting (chronic) coughing.  What are the signs or symptoms? Symptoms can depend on the size of the hernia. Often, a small inguinal hernia has no symptoms. Symptoms of a larger hernia include:  A lump in the groin. This is easier to see when the person is standing. It might not be visible when he or she is lying down.  Pain or burning in the groin. This occurs especially when lifting, straining, or coughing.  A dull ache or a  feeling of pressure in the groin.  A lump in the scrotum in men.  Symptoms of a strangulated inguinal hernia can include:  A bulge in the groin that is very painful and tender to the touch.  A bulge that turns red or purple.  Fever, nausea, and vomiting.  The inability to have a bowel movement or to pass gas.  How is this diagnosed? This condition is diagnosed with a medical history and physical exam. Your health care provider may feel your groin area and ask you to cough. How is this treated? Treatment for this condition varies depending on the size of your hernia and whether you have symptoms. If you do not have symptoms, your health care provider may have you watch your hernia carefully and come in for follow-up visits. If your hernia is larger or if you have symptoms, your treatment will include surgery. Follow these instructions at home: Lifestyle  Drink enough fluid to keep your urine clear or pale yellow.  Eat a diet that includes a lot of fiber. Eat plenty of fruits, vegetables, and whole grains. Talk with your health care provider if you have questions.  Avoid lifting heavy objects.  Avoid standing for long periods of time.  Do not use tobacco products, including cigarettes, chewing tobacco, or e-cigarettes. If you need help quitting, ask your health care provider.  Maintain a healthy weight. General instructions  Do not try to force the hernia back in.  Watch your hernia for any changes in color or size. Let your  health care provider know if any changes occur.  Take over-the-counter and prescription medicines only as told by your health care provider.  Keep all follow-up visits as told by your health care provider. This is important. Contact a health care provider if:  You have a fever.  You have new symptoms.  Your symptoms get worse. Get help right away if:  You have pain in the groin that suddenly gets worse.  A bulge in the groin gets bigger suddenly  and does not go down.  You are a man and you have a sudden pain in the scrotum, or the size of your scrotum suddenly changes.  A bulge in the groin area becomes red or purple and is painful to the touch.  You have nausea or vomiting that does not go away.  You feel your heart beating a lot more quickly than normal.  You cannot have a bowel movement or pass gas. This information is not intended to replace advice given to you by your health care provider. Make sure you discuss any questions you have with your health care provider. Document Released: 05/24/2008 Document Revised: 06/13/2015 Document Reviewed: 11/15/2013 Elsevier Interactive Patient Education  2018 [place]!  We strongly recommend that you stop smoking.  Smoking increases the risk of surgery including infection in the form of an open wound, pus formation, abscess, hernia at an incision on the abdomen, etc.  You have an increased risk of other MAJOR complications such as stroke, heart attack, forming clots in the leg and/or lungs, and death.    Smoking Cessation Quitting smoking is important to your health and has many advantages. However, it is not always easy to quit since nicotine is a very addictive drug. Often times, people try 3 times or more before being able to quit. This document explains the best ways for you to prepare to quit smoking. Quitting takes hard work and a lot of effort, but you can do it. ADVANTAGES OF QUITTING SMOKING  You will live longer, feel better, and live better.  Your body will feel the impact of quitting smoking almost immediately.  Within 20 minutes, blood pressure decreases. Your pulse returns to its normal level.  After 8 hours, carbon monoxide levels in the blood return to normal. Your oxygen level increases.  After 24 hours, the chance of having a heart attack starts to decrease. Your breath, hair, and body stop smelling like smoke.  After 48 hours, damaged nerve  endings begin to recover. Your sense of taste and smell improve.  After 72 hours, the body is virtually free of nicotine. Your bronchial tubes relax and breathing becomes easier.  After 2 to 12 weeks, lungs can hold more air. Exercise becomes easier and circulation improves.  The risk of having a heart attack, stroke, cancer, or lung disease is greatly reduced.  After 1 year, the risk of coronary heart disease is cut in half.  After 5 years, the risk of stroke falls to the same as a nonsmoker.  After 10 years, the risk of lung cancer is cut in half and the risk of other cancers decreases significantly.  After 15 years, the risk of coronary heart disease drops, usually to the level of a nonsmoker.  If you are pregnant, quitting smoking will improve your chances of having a healthy baby.  The people you live with, especially any children, will be healthier.  You will have extra money to spend on things other than cigarettes. QUESTIONS  TO THINK ABOUT BEFORE ATTEMPTING TO QUIT You may want to talk about your answers with your caregiver.  Why do you want to quit?  If you tried to quit in the past, what helped and what did not?  What will be the most difficult situations for you after you quit? How will you plan to handle them?  Who can help you through the tough times? Your family? Friends? A caregiver?  What pleasures do you get from smoking? What ways can you still get pleasure if you quit? Here are some questions to ask your caregiver:  How can you help me to be successful at quitting?  What medicine do you think would be best for me and how should I take it?  What should I do if I need more help?  What is smoking withdrawal like? How can I get information on withdrawal? GET READY  Set a quit date.  Change your environment by getting rid of all cigarettes, ashtrays, matches, and lighters in your home, car, or work. Do not let people smoke in your home.  Review your past  attempts to quit. Think about what worked and what did not. GET SUPPORT AND ENCOURAGEMENT You have a better chance of being successful if you have help. You can get support in many ways.  Tell your family, friends, and co-workers that you are going to quit and need their support. Ask them not to smoke around you.  Get individual, group, or telephone counseling and support. Programs are available at [employer] and health centers. Call your local health department for information about programs in your area.  Spiritual beliefs and practices may help some smokers quit.  Download a "quit meter" on your computer to keep track of quit statistics, such as how long you have gone without smoking, cigarettes not smoked, and money saved.  Get a self-help book about quitting smoking and staying off of tobacco. [place] yourself from urges to smoke. Talk to someone, go for a walk, or occupy your time with a task.  Change your normal routine. Take a different route to work. Drink tea instead of coffee. Eat breakfast in a different place.  Reduce your stress. Take a hot bath, exercise, or read a book.  Plan something enjoyable to do every day. Reward yourself for not smoking.  Explore interactive web-based programs that specialize in helping you quit. GET MEDICINE AND USE IT CORRECTLY Medicines can help you stop smoking and decrease the urge to smoke. Combining medicine with the above behavioral methods and support can greatly increase your chances of successfully quitting smoking.  Nicotine replacement therapy helps deliver nicotine to your body without the negative effects and risks of smoking. Nicotine replacement therapy includes nicotine gum, lozenges, inhalers, nasal sprays, and skin patches. Some may be available over-the-counter and others require a prescription.  Antidepressant medicine helps people abstain from smoking, but how this works is unknown. This  medicine is available by prescription.  Nicotinic receptor partial agonist medicine simulates the effect of nicotine in your brain. This medicine is available by prescription. Ask your caregiver for advice about which medicines to use and how to use them based on your health history. Your caregiver will tell you what side effects to look out for if you choose to be on a medicine or therapy. Carefully read the information on the package. Do not use any other product containing nicotine while using a nicotine replacement product.  RELAPSE OR  DIFFICULT SITUATIONS Most relapses occur within the first 3 months after quitting. Do not be discouraged if you start smoking again. Remember, most people try several times before finally quitting. You may have symptoms of withdrawal because your body is used to nicotine. You may crave cigarettes, be irritable, feel very hungry, cough often, get headaches, or have difficulty concentrating. The withdrawal symptoms are only temporary. They are strongest when you first quit, but they will go away within 10 14 days. To reduce the chances of relapse, try to:  Avoid drinking alcohol. Drinking lowers your chances of successfully quitting.  Reduce the amount of caffeine you consume. Once you quit smoking, the amount of caffeine in your body increases and can give you symptoms, such as a rapid heartbeat, sweating, and anxiety.  Avoid smokers because they can make you want to smoke.  Do not let weight gain distract you. Many smokers will gain weight when they quit, usually less than 10 pounds. Eat a healthy diet and stay active. You can always lose the weight gained after you quit.  Find ways to improve your mood other than smoking. FOR MORE INFORMATION  www.smokefree.gov    While it can be one of the most difficult things to do, the [place] community has programs to help you stop.  Consider talking with your primary care physician about options.  Also, Smoking Cessation  classes are available through the [facility]:  The smoking cessation program is a proven-effective program from the American Lung Association. The program is available for anyone 61 and older who currently smokes. The program lasts for 7 weeks and is 8 sessions. Each class will be approximately 1 1/2 hours. The program is every Tuesday.  All classes are 12-1:30pm and same location.  Event Location Information:  Location: [place] 2nd Floor Conference Room 2-037; located next to [facility] cross streets: [place] Entrance into the [facility] is adjacent to the [employer] main entrance. The conference room is located on the 2nd floor.  Parking Instructions: Visitor parking is adjacent to [employer] main entrance and the [place]    A smoking cessation program is also offered through the [facility]. Register online at ClickDebate.gl or call [phone] for more information.   Tobacco cessation counseling is available at [facility]. Call [phone] for a free appointment.   Tobacco cessation classes also are available through the [place] in [place]. For information, call [phone].   The Patient Education Network features videos on tobacco cessation. Please consult your listings in the center of this book to find instructions on how to access this resource.   If you want more information, ask your nurse.      Managing Pain  ######################################################################   CONTROL PAIN Control pain so that you can walk, sleep, tolerate sneezing/coughing, go up/down stairs.  (Good pain control is not pain free only when lying still, unable to move)  WALK Walk an hour a day.  Control your pain to do that.   HAVE A BOWEL MOVEMENT DAILY Keep your bowels regular to avoid problems.  OK to try a  laxative to override constipation.  OK to use an antidairrheal to slow down diarrhea.  Call if not better after 2 tries  CALL IF YOU HAVE PROBLEMS/CONCERNS Call if you are still struggling despite following these instructions. Call if you have concerns not answered by  these instructions  ######################################################################    Pain after surgery or related to activity is often due to strain/injury to muscle, tendon, nerves and/or incisions.  This pain is usually short-term and will improve in a few months.   Many people find it helpful to do the following things TOGETHER to help speed the process of healing and to get back to regular activity more quickly:  1. Avoid heavy physical activity at first a. No lifting greater than 20 pounds at first, then increase to lifting as tolerated over the next few weeks b. Do not push through the pain.  Listen to your body and avoid positions and maneuvers than reproduce the pain.  Wait a few days before trying something more intense c. Walking is okay as tolerated, but go slowly and stop when getting sore.  If you can walk 30 minutes without stopping or pain, you can try more intense activity (running, jogging, aerobics, cycling, swimming, treadmill, sex, sports, weightlifting, etc ) d. Remember: If it hurts to do it, then dont do it!  2. Take Anti-inflammatory medication a. Choose ONE of the following over-the-counter medications: i.            Acetaminophen 500mg  tabs (Tylenol) 1-2 pills with every meal and just before bedtime (avoid if you have liver problems) ii.            Naproxen 220mg  tabs (ex. Aleve) 1-2 pills twice a day (avoid if you have kidney, stomach, IBD, or bleeding problems) iii. Ibuprofen 200mg  tabs (ex. Advil, Motrin) 3-4 pills with every meal and just before bedtime (avoid if you have kidney, stomach, IBD, or bleeding problems) b. Take with food/snack around the clock for 1-2 weeks i. This helps  the muscle and nerve tissues become less irritable and calm down faster  3. Use a Heating pad or Ice/Cold Pack a. 4-6 times a day b. May use warm bath/hottub  or showers  4. Try Gentle Massage and/or Stretching  a. at the area of pain many times a day b. stop if you feel pain - do not overdo it  Try these steps together to help you body heal faster and avoid making things get worse.  Doing just one of these things may not be enough.    If you are not getting better after two weeks or are noticing you are getting worse, contact our office for further advice; we may need to re-evaluate you & see what other things we can do to help.

## 2017-08-02 ENCOUNTER — Telehealth: Payer: Self-pay

## 2017-08-02 ENCOUNTER — Telehealth: Payer: Self-pay | Admitting: Interventional Cardiology

## 2017-08-02 NOTE — Telephone Encounter (Signed)
Message received from Haze Justin, RN CM requesting a hospital follow up appointment for the patient.   Call placed to the patient. He stated that he is currently seeing Dr Inda Merlin as his PCP. He explained that he had a PCP at [facility] but he decided to change PCPs when the prior PCP did not submit medical records in a timely manner for his disability application.  The patient currently has no insurance and had a pending disability claim. He said that he has not applied for medicaid and will need to go to DSS to apply.  He said that Dr Inda Merlin has refilled all of his medications for 2 months but he will not see him going forward without insurance. He said that Dr Inda Merlin has given him discount cards for his medication.  This CM explained about the services at [facility], including financial assistance, pharmacy, social work. The patient stated that he has been to the [facility] in the past but was not sure at this time that he wanted to return. He said that obtaining the pain medication that he needs is very important and he is not sure that he will be able to get it prescribed by a provider at [facility]  He did not want to schedule an appointment at this time but took the phone # for the clinic and said he would call if he decided to change PCP.  The patient currently has no insurance and had a pending disability claim. He said that he has not applied for medicaid and will need to go to DSS to apply.   Update provided to A. Kritzer, RN CM.

## 2017-08-02 NOTE — Care Management Note (Signed)
Case Management Note  CM contacted for follow up specialist assistance with no ins.  CM sent a message to [facility] CM for possible appointment openings to establish care and then get financial counseling assistance.  Selinda Eon, [place].  No further CM needs noted at this time.  Fable Huisman, Benjaman Lobe, RN 08/02/2017, 9:36 AM

## 2017-08-02 NOTE — Telephone Encounter (Signed)
Pt stating we will be getting clearance papers from Dr Johney Maine for him to have surgery

## 2017-08-03 ENCOUNTER — Telehealth: Payer: Self-pay | Admitting: Interventional Cardiology

## 2017-08-03 NOTE — Telephone Encounter (Signed)
   [facility] Pre-operative Risk Assessment    Request for surgical clearance:  1. What type of surgery is being performed? Hernia repair   2. When is this surgery scheduled? TBD   3. What type of clearance is required (medical clearance vs. Pharmacy clearance to hold med vs. Both)?  Both  4. Are there any medications that need to be held prior to surgery and how long? Plavix   5. Practice name and name of physician performing surgery? [facility]   6. What is your office phone number? [phone]    7.   What is your office fax number? [place], CMA  8.   Anesthesia type (None, local, MAC, general) ? General    _________________________________________________________________   (provider comments below)

## 2017-08-03 NOTE — Telephone Encounter (Signed)
Appointment scheduled for clearance on 08/24/17 at 1130 with Tru[place] Merle, NP.

## 2017-08-03 NOTE — Telephone Encounter (Signed)
   Primary Cardiologist:Dr Tamala Julian  Chart reviewed as part of pre-operative protocol coverage. Because of Damon Smith's past medical history and time since last visit, he/she will require a follow-up visit in order to better assess preoperative cardiovascular risk.  Pre-op covering staff: - Please schedule appointment and call patient to inform them. - Please contact requesting surgeon's office via preferred method (i.e, phone, fax) to inform them of need for appointment prior to surgery.  Kerin Ransom, PA-C  08/03/2017, 4:37 PM

## 2017-08-03 NOTE — Telephone Encounter (Signed)
Clearance received and sent to pre op team

## 2017-08-24 ENCOUNTER — Encounter: Payer: Self-pay | Admitting: Nurse Practitioner

## 2017-08-24 ENCOUNTER — Ambulatory Visit (INDEPENDENT_AMBULATORY_CARE_PROVIDER_SITE_OTHER): Payer: Self-pay | Admitting: Nurse Practitioner

## 2017-08-24 VITALS — BP 112/70 | HR 70 | Ht 70.0 in | Wt 195.1 lb

## 2017-08-24 DIAGNOSIS — R0600 Dyspnea, unspecified: Secondary | ICD-10-CM

## 2017-08-24 DIAGNOSIS — E7849 Other hyperlipidemia: Secondary | ICD-10-CM

## 2017-08-24 DIAGNOSIS — R609 Edema, unspecified: Secondary | ICD-10-CM

## 2017-08-24 DIAGNOSIS — Z01810 Encounter for preprocedural cardiovascular examination: Secondary | ICD-10-CM

## 2017-08-24 MED ORDER — METOPROLOL TARTRATE 25 MG PO TABS: 50.0000 mg | ORAL_TABLET | Freq: Two times a day (BID) | ORAL | 2 refills | Status: DC

## 2017-08-24 NOTE — Progress Notes (Addendum)
CARDIOLOGY OFFICE NOTE  Date:  08/24/2017    [name] Date of Birth: 11/03/1965 Medical Record #[id number]  PCP:  Josetta Huddle, MD  Cardiologist:  Tamala Julian  Chief Complaint  Patient presents with  . Pre-op Exam    Seen for Dr. Tamala Julian    History of Present Illness: Damon Smith is a 52 y.o. male who presents today for a pre op visit. Seen for Dr. Tamala Julian.   Last seen here in September of 2018. Still smoking. Has chronic opiate dependency.   He has a history of CAD with RCA stents during STEMI of inf wall 09/2014 (cath 09/2014 he had 45% LM stenosis, and 50% mid LAD to distal LAD, 1st diag 70% stenosed), heavy tobacco use, depression, chronic diastolic heart failure with EF greater than 50%, and chronic difficulty sleeping.     He has undergone laparoscopic kidney resection for renal cell carcinoma. This was done at [facility]. No cardiac complications. Plavix was discontinued for at least 5 days prior to that procedure. Has chronic pain syndrome due to multiple other disease entities.  Comes in today. Here alone. Needing clearance for inguinal hernia surgery with Dr. Johney Maine. He says he does not wish to have this done. He may be changing surgeons. Was in the ER back in July with this.  His disability has just come thru. Lot of issues. Says his narcotics no longer work for him. Smoking "all natural" now - "rolls his own". Breathing is ok "since I am rolling my own". He does get short winded. Has lots of issues with not feeling good, no actual chest pain, no NTG use, but with leg swelling, chronic depression, fatigue, back pain, anxiety and balance issues. Lots of swelling - tells me he has had "14" episodes of this - his eyes get itching - he feels blotchy - he thinks this is from his cancer. He notes that "his thinking" not as good since his nephrectomy last year.  Unclear how much salt he is getting. Admits he really has no will to live. He has  been taking a total of 50 mg of Lopressor - says that is "more beneficial for me".   Past Medical History:  Diagnosis Date  . Chronic pain 10/16/2014   On Narcotics @ home.  . Coronary artery disease involving native heart with unstable angina pectoris ([place]) 10/17/2014   LM ~45%, mRCA 99% (DES PCI), RPL3 70%, mLAD 50% & ostial D1 70%, OM1 75%  . Essential hypertension 10/17/2014  . Hyperlipidemia with target LDL less than 70 10/16/2014  . Presence of DES in RCA : Synergy 4.0 mm x 28 mm (4.5 mm) 10/17/2014  . ST elevation myocardial infarction (STEMI) of inferior wall, initial episode of care ([place]) 10/16/2014  . Tobacco abuse 10/16/2014    Past Surgical History:  Procedure Laterality Date  . CARDIAC CATHETERIZATION N/A 10/16/2014   Procedure: Left Heart Cath and Coronary Angiography;  Surgeon: Belva Crome, MD;  Location: [facility];  Service: Cardiovascular;  LM ~45%, mRCA 99% (DES PCI), RPL3 70%, mLAD 50% & ostial D1 70%, OM1 75%  . CARDIAC CATHETERIZATION N/A 10/16/2014   Procedure: Coronary Stent Intervention;  Surgeon: Belva Crome, MD;  Location: [facility];  Service: Cardiovascular;  mRCA 99% --> Synergy DES 4.0 mm x 28 mm (4.5 mm)  . knee pain    . NO PAST SURGERIES    . TRANSTHORACIC ECHOCARDIOGRAM  Medications: Current Meds  Medication Sig  . atorvastatin (LIPITOR) 80 MG tablet TAKE ONE TABLET BY MOUTH DAILY AT 6PM  . clopidogrel (PLAVIX) 75 MG tablet TAKE ONE TABLET BY MOUTH DAILY  . diazepam (VALIUM) 5 MG tablet Take 5 mg by mouth 2 (two) times daily.  Marland Kitchen gabapentin (NEURONTIN) 300 MG capsule Take 300 mg by mouth 3 (three) times daily.  Marland Kitchen HYDROcodone-acetaminophen (NORCO) 10-325 MG tablet Take 1 tablet by mouth every 6 (six) hours as needed for severe pain.  Marland Kitchen lisinopril (PRINIVIL,ZESTRIL) 10 MG tablet Take 10 mg by mouth daily.  . metoprolol tartrate (LOPRESSOR) 25 MG tablet TAKE ONE AND ONE-HALF (1 & 1/2) TABLET BY MOUTH TWO TIMES A DAY  . Multiple  Vitamin (MULTI-VITAMINS) TABS Take 1 tablet by mouth daily.  . nitroGLYCERIN (NITROSTAT) 0.4 MG SL tablet Place 1 tablet (0.4 mg total) under the tongue every 5 (five) minutes x 3 doses as needed for chest pain.  . vitamin E 400 UNIT capsule Take 400 Units by mouth daily.     Allergies: No Known Allergies  Social History: The patient  reports that he has been smoking.  He has been smoking about 2.00 packs per day. He has never used smokeless tobacco. He reports that he does not drink alcohol or use drugs.   Family History: The patient's family history includes Heart attack (age of onset: 53) in his father; Hyperlipidemia in his father; Hypertension in his father.   Review of Systems: Please see the history of present illness.   Otherwise, the review of systems is positive for none.   All other systems are reviewed and negative.   Physical Exam: VS:  BP 112/70 (BP Location: Left Arm, Patient Position: Sitting, Cuff Size: Normal)   Pulse 70   Ht 5\' 10"  (1.778 m)   Wt 195 lb 1.6 oz (88.5 kg)   BMI 27.99 kg/m  .  BMI Body mass index is 27.99 kg/m.  Wt Readings from Last 3 Encounters:  08/24/17 195 lb 1.6 oz (88.5 kg)  07/31/17 182 lb (82.6 kg)  10/01/16 194 lb 12.8 oz (88.4 kg)    General: Seems sedated to me. Looks older than his stated age. He is in no acute distress.   HEENT: Normal.  Neck: Supple, no JVD, carotid bruits, or masses noted.  Cardiac: Regular rate and rhythm. Heart tones are distant. Over 1+ edema.  Respiratory:  Lungs are coarse.  GI: Soft and nontender.  MS: No deformity or atrophy. Gait and ROM intact but has difficulty walking - not able to sit for long periods on the exam table.  Skin: Warm and dry. Color is normal.  Neuro:  Strength and sensation are intact and no gross focal deficits noted.  Psych: Alert, appropriate and with normal affect.   LABORATORY DATA:  EKG:  EKG is ordered today. This demonstrates NSR.  Lab Results  Component Value Date    WBC 10.7 (H) 07/31/2017   HGB 13.6 07/31/2017   HCT 40.0 07/31/2017   PLT 259 07/31/2017   GLUCOSE 93 07/31/2017   CHOL 150 06/20/2015   TRIG 165 (H) 06/20/2015   HDL 39 (L) 06/20/2015   LDLCALC 78 06/20/2015   ALT 29 03/31/2016   AST 24 03/31/2016   NA 141 07/31/2017   K 3.8 07/31/2017   CL 104 07/31/2017   CREATININE 1.10 07/31/2017   BUN 14 07/31/2017   CO2 26 07/31/2017   INR 0.95 03/31/2016   HGBA1C 5.5 10/17/2014  BNP (last 3 results) No results for input(s): BNP in the last 8760 hours.  ProBNP (last 3 results) No results for input(s): PROBNP in the last 8760 hours.   Other Studies Reviewed Today:  Echo Study Conclusions 09/2014  - Left ventricle: The cavity size was normal. Systolic function was   normal. The estimated ejection fraction was in the range of 55%   to 60%. Hypokinesis of the basal-midinferior myocardium. Left   ventricular diastolic function parameters were normal. - Aortic valve: There was trivial regurgitation. - Mitral valve: There was mild regurgitation. - Pulmonary arteries: PA peak pressure: 33 mm Hg (S).  Left Heart Cath and Coronary Angiography 09/2014  Conclusion   1. 1st RPLB lesion, 70% stenosed. 2. 1st Mrg lesion, 75% stenosed. 3. Mid LAD to Dist LAD lesion, 50% stenosed. 4. Ost 1st Diag lesion, 70% stenosed. 5. 1st Diag lesion, 35% stenosed. 6. LM lesion, 45% stenosed. 7. Prox RCA to Dist RCA lesion, 99% stenosed. There is a 0% residual stenosis post intervention. 8. A drug-eluting stent was placed.    Acute inferior wall ST elevation myocardial infarction due to high-grade mid right coronary obstruction with likely distal embolization to the microcirculation.  Successful PTCA and stenting of the mid right coronary from 99% to 0% with TIMI grade 3 flow. Residual 50-70% left ventricular branch obstruction is noted.  Moderate distal left main, moderate first diagonal stenosis, mild-to-moderate diffuse mid LAD, and moderately  severe first obtuse marginal  Low normal left ventricular function with inferoapical hypokinesis. EF 45-50%.   RECOMMENDATIONS:   Aggressive risk factor modification including lipid management and smoking cessation.  Care management assistance with medication needs.  Phase I into cardiac rehabilitation  Discharge 48-72 hours.  Patient is high risk for medication noncompliance.    Assessment/Plan:  1. Pre op clearance - not able to complete 4 mets of activity - known residual CAD - will need Lexiscan Myoview. I will ask Dr. Tamala Julian to advise about holding the Plavix. The patient has been advised to let us know the surgeon's name if he does switch.   2. Lower extremity edema - checking echocardiogram.   3. Known CAD - no active chest pain but is having more shortness of breath. Asprin was stopped previously by Dr. Tamala Julian. Ok to stay on his current dose of Metoprolol.   4. Chronic pain syndrome  5. HTN - BP ok today  6. HLD - no lipids - he has not eaten yet today - will check lab today  7. Prior renal cell carcinoma - followed at [facility].   8. Ongoing tobacco abuse - he is not ready to stop.    Current medicines are reviewed with the patient today.  The patient does not have concerns regarding medicines other than what has been noted above.  The following changes have been made:  See above.  Labs/ tests ordered today include:    Orders Placed This Encounter  Procedures  . Hepatic function panel  . Lipid panel  . MYOCARDIAL PERFUSION IMAGING  . EKG 12-Lead  . ECHOCARDIOGRAM COMPLETE     Disposition:   FU with Dr. Tamala Julian as planned later this summer.    Patient is agreeable to this plan and will call if any problems develop in the interim.   SignedTruitt Merle, NP  08/24/2017 12:30 PM  [facility] [address] [place], [place]  [address] Phone: [phone] Fax: [phone]  Addendum: From Dr. Tamala Julian -  It would be okay to hold Plavix 5 days prior to herniorrhaphy.  Burtis Junes, RN, [facility] [address] [place], [place]  [address] [phone]

## 2017-08-24 NOTE — Patient Instructions (Addendum)
We will be checking the following labs today - HPF and lipids   Medication Instructions:    Continue with your current medicines.     Testing/Procedures To Be Arranged:  Lexiscan Myoview  Echocardiogram  Follow-Up:   See Dr. Tamala Julian later this fall as planned.    Other Special Instructions:   Please let us know if you change surgeons.   I will ask Dr. Tamala Julian to give his input regarding holding the Plavix.    You are scheduled for a Myocardial Perfusion Imaging Study on ____________________________________ at ____________________________________________.   Please arrive 15 minutes prior to your appointment time for registration and insurance purposes.   The test will take approximately 3 to 4 hours to complete; you may bring reading material. If someone comes with you to your appointment, they will need to remain in the main lobby due to limited space in the testing area.   If you are pregnant or breastfeeding, please notify the nuclear lab prior to your appointment.   How to prepare for your Myocardial Perfusion test:   Do not eat or drink 3 hours prior to your test, except you may have water.    Do not consume products containing caffeine (regular or decaffeinated) 12 hours prior to your test (ex: coffee, chocolate, soda, tea)   Do bring a list of your current medications with you. If not listed below, you may take your medications as normal.     Bring any held medication to your appointment, as you may be required to take it once the test is complete.   Do wear comfortable clothes (no dresses or overalls) and walking shoes. Tennis shoes are preferred. No heels or open toed shoes.  Do not wear cologne, perfume, aftershave or lotions (deodorant is allowed).   If these instructions are not followed, you test will have to be rescheduled.   Please report to [address] for your test. If you have questions or concerns about your appointment,  please call the Nuclear Lab at [phone].  If you cannot keep your appointment, please provide 24 hour notification to the Nuclear lab to avoid a possible $50 charge to your account.        If you need a refill on your cardiac medications before your next appointment, please call your pharmacy.   Call the [facility] office at [phone] if you have any questions, problems or concerns.

## 2017-08-25 LAB — HEPATIC FUNCTION PANEL
ALT: 15 IU/L (ref 0–44)
AST: 11 IU/L (ref 0–40)
Albumin: 4.5 g/dL (ref 3.5–5.5)
Alkaline Phosphatase: 66 IU/L (ref 39–117)
Bilirubin Total: 0.5 mg/dL (ref 0.0–1.2)
Bilirubin, Direct: 0.15 mg/dL (ref 0.00–0.40)
Total Protein: 7.1 g/dL (ref 6.0–8.5)

## 2017-08-25 LAB — LIPID PANEL
Chol/HDL Ratio: 3.7 ratio (ref 0.0–5.0)
Cholesterol, Total: 106 mg/dL (ref 100–199)
HDL: 29 mg/dL — ABNORMAL LOW (ref >39)
LDL Calculated: 37 mg/dL (ref 0–99)
Triglycerides: 202 mg/dL — ABNORMAL HIGH (ref 0–149)
VLDL Cholesterol Cal: 40 mg/dL (ref 5–40)

## 2017-08-26 ENCOUNTER — Telehealth (HOSPITAL_COMMUNITY): Payer: Self-pay | Admitting: *Deleted

## 2017-08-26 NOTE — Telephone Encounter (Signed)
Patient given detailed instructions per Myocardial Perfusion Study Information Sheet for the test on 09/01/17. Patient notified to arrive 15 minutes early and that it is imperative to arrive on time for appointment to keep from having the test rescheduled.  If you need to cancel or reschedule your appointment, please call the office within 24 hours of your appointment. . Patient verbalized understanding. Kirstie Peri, RN

## 2017-09-01 ENCOUNTER — Ambulatory Visit (HOSPITAL_BASED_OUTPATIENT_CLINIC_OR_DEPARTMENT_OTHER): Payer: Medicaid Other

## 2017-09-01 ENCOUNTER — Other Ambulatory Visit: Payer: Self-pay

## 2017-09-01 ENCOUNTER — Other Ambulatory Visit (HOSPITAL_COMMUNITY): Payer: Self-pay

## 2017-09-01 ENCOUNTER — Ambulatory Visit (HOSPITAL_COMMUNITY): Payer: Medicaid Other | Attending: Cardiology

## 2017-09-01 DIAGNOSIS — Z01810 Encounter for preprocedural cardiovascular examination: Secondary | ICD-10-CM | POA: Diagnosis present

## 2017-09-01 DIAGNOSIS — R609 Edema, unspecified: Secondary | ICD-10-CM

## 2017-09-01 DIAGNOSIS — I252 Old myocardial infarction: Secondary | ICD-10-CM | POA: Diagnosis not present

## 2017-09-01 DIAGNOSIS — R0600 Dyspnea, unspecified: Secondary | ICD-10-CM

## 2017-09-01 DIAGNOSIS — I083 Combined rheumatic disorders of mitral, aortic and tricuspid valves: Secondary | ICD-10-CM | POA: Diagnosis not present

## 2017-09-01 DIAGNOSIS — I509 Heart failure, unspecified: Secondary | ICD-10-CM | POA: Diagnosis not present

## 2017-09-01 LAB — MYOCARDIAL PERFUSION IMAGING
LV dias vol: 118 mL (ref 62–150)
LV sys vol: 48 mL
Peak HR: 86 {beats}/min
RATE: 0.36
Rest HR: 62 {beats}/min
SDS: 3
SRS: 5
SSS: 8
TID: 0.97

## 2017-09-01 LAB — ECHOCARDIOGRAM COMPLETE
Height: 70 in
Weight: 3120 oz

## 2017-09-01 MED ORDER — TECHNETIUM TC 99M TETROFOSMIN IV KIT
32.3000 | PACK | Freq: Once | INTRAVENOUS | Status: AC | PRN
  Administered 2017-09-01: 32.3 via INTRAVENOUS
  Filled 2017-09-01: qty 33

## 2017-09-01 MED ORDER — REGADENOSON 0.4 MG/5ML IV SOLN
0.4000 mg | Freq: Once | INTRAVENOUS | Status: AC
  Administered 2017-09-01: 0.4 mg via INTRAVENOUS

## 2017-09-01 MED ORDER — TECHNETIUM TC 99M TETROFOSMIN IV KIT
10.3000 | PACK | Freq: Once | INTRAVENOUS | Status: AC | PRN
  Administered 2017-09-01: 10.3 via INTRAVENOUS
  Filled 2017-09-01: qty 11

## 2017-09-13 ENCOUNTER — Ambulatory Visit: Payer: Self-pay | Admitting: Surgery

## 2017-09-13 DIAGNOSIS — Z79891 Long term (current) use of opiate analgesic: Secondary | ICD-10-CM | POA: Insufficient documentation

## 2017-11-08 NOTE — Progress Notes (Signed)
Cardiology Office Note:    Date:  11/09/2017   ID:  [name], DOB 10-05-1965, MRN [id number]  PCP:  Josetta Huddle, MD  Cardiologist:  No primary care provider on file.   Referring MD: Josetta Huddle, MD   Chief Complaint  Patient presents with  . Coronary Artery Disease    History of Present Illness:    Damon Smith is a 52 y.o. male with a hx of CAD with RCA stents during STEMI of inf wall (cath 09/2014 he had 45% LM stenosis, and 50% mid LAD to distal LAD , 1st diag 70% stenosed), heavy tobacco use, depression, chronic diastolic heart failure with EF greater than 50%, and difficulty sleeping.Damon Smith  He has a hernia surgery.  He has not had angina or need for sublingual nitroglycerin.  He continues to smoke cigarettes up to 1 pack/day.  No orthopnea, PND, or dyspnea on exertion.  He does have bilateral lower extremity swelling and recently had statin therapy stopped because of an elevated CPK of 709 on blood work by Dr. Mertha Finders October 20, 2017.     Past Medical History:  Diagnosis Date  . Chronic pain 10/16/2014   On Narcotics @ home.  . Coronary artery disease involving native heart with unstable angina pectoris ([place]) 10/17/2014   LM ~45%, mRCA 99% (DES PCI), RPL3 70%, mLAD 50% & ostial D1 70%, OM1 75%  . Essential hypertension 10/17/2014  . Hyperlipidemia with target LDL less than 70 10/16/2014  . Presence of DES in RCA : Synergy 4.0 mm x 28 mm (4.5 mm) 10/17/2014  . ST elevation myocardial infarction (STEMI) of inferior wall, initial episode of care ([place]) 10/16/2014  . Tobacco abuse 10/16/2014    Past Surgical History:  Procedure Laterality Date  . CARDIAC CATHETERIZATION N/A 10/16/2014   Procedure: Left Heart Cath and Coronary Angiography;  Surgeon: Belva Crome, MD;  Location: [facility];  Service: Cardiovascular;  LM ~45%, mRCA 99% (DES PCI), RPL3 70%, mLAD 50% & ostial D1 70%, OM1 75%  . CARDIAC CATHETERIZATION N/A 10/16/2014   Procedure: Coronary  Stent Intervention;  Surgeon: Belva Crome, MD;  Location: [facility];  Service: Cardiovascular;  mRCA 99% --> Synergy DES 4.0 mm x 28 mm (4.5 mm)  . knee pain    . NO PAST SURGERIES    . TRANSTHORACIC ECHOCARDIOGRAM      Current Medications: Current Meds  Medication Sig  . clopidogrel (PLAVIX) 75 MG tablet TAKE ONE TABLET BY MOUTH DAILY  . diazepam (VALIUM) 5 MG tablet Take 5 mg by mouth 2 (two) times daily.  Damon Smith HYDROcodone-acetaminophen (NORCO) 10-325 MG tablet Take 1 tablet by mouth 6 (six) times daily.  Damon Smith lisinopril (PRINIVIL,ZESTRIL) 10 MG tablet Take 10 mg by mouth daily.  . Multiple Vitamin (MULTI-VITAMINS) TABS Take 1 tablet by mouth daily.  . nitroGLYCERIN (NITROSTAT) 0.4 MG SL tablet Place 1 tablet (0.4 mg total) under the tongue every 5 (five) minutes x 3 doses as needed for chest pain.  . pregabalin (LYRICA) 150 MG capsule Take 150 mg by mouth 2 (two) times daily.  . vitamin E 400 UNIT capsule Take 400 Units by mouth daily.     Allergies:   Patient has no known allergies.   Social History   Socioeconomic History  . Marital status: Single    Spouse name: Not on file  . Number of children: Not on file  . Years of education: Not on file  . Highest  education level: Not on file  Occupational History  . Not on file  Social Needs  . Financial resource strain: Not on file  . Food insecurity:    Worry: Not on file    Inability: Not on file  . Transportation needs:    Medical: Not on file    Non-medical: Not on file  Tobacco Use  . Smoking status: Current Every Day Smoker    Packs/day: 2.00  . Smokeless tobacco: Never Used  . Tobacco comment: 2 ppd  Substance and Sexual Activity  . Alcohol use: No    Alcohol/week: 0.0 standard drinks  . Drug use: No  . Sexual activity: Not on file  Lifestyle  . Physical activity:    Days per week: Not on file    Minutes per session: Not on file  . Stress: Not on file  Relationships  . Social connections:    Talks on  phone: Not on file    Gets together: Not on file    Attends religious service: Not on file    Active member of club or organization: Not on file    Attends meetings of clubs or organizations: Not on file    Relationship status: Not on file  Other Topics Concern  . Not on file  Social History Narrative  . Not on file     Family History: The patient's family history includes Heart attack (age of onset: 82) in his father; Hyperlipidemia in his father; Hypertension in his father.  ROS:   Please see the history of present illness.    Not sleeping well, noticing bilateral lower extremity swelling.  This began occurring when he had a left nephrectomy performed for renal cell carcinoma.  Notices some vision disturbance, shortness of breath, back pain, muscle pain rash on the legs, balance, joint discomfort, facial swelling, anxiety, and leg pain.  All other systems reviewed and are negative.  EKGs/Labs/Other Studies Reviewed:    The following studies were reviewed today:  Myocardial perfusion imaging August 2019:  Study Highlights      Nuclear stress EF: 59%. No wall motion abnormalities.  There was no ST segment deviation noted during stress.  Defect 1: There is a medium defect of moderate severity present in the basal inferoseptal, apical septal and apical inferior location.  Findings consistent with prior myocardial infarction with peri-infarct ischemia.  This is an intermediate risk study.   2D Doppler echocardiogram August 2019: Study Conclusions   - Left ventricle: The cavity size was normal. There was mild focal   basal hypertrophy of the septum. Systolic function was normal.   The estimated ejection fraction was in the range of 55% to 60%.   Wall motion was normal; there were no regional wall motion   abnormalities. Doppler parameters are consistent with abnormal   left ventricular relaxation (grade 1 diastolic dysfunction). - Aortic valve: Trileaflet; mildly thickened,  mildly calcified   leaflets. There was trivial regurgitation. - Mitral valve: There was trivial regurgitation. - Tricuspid valve: There was mild regurgitation.   EKG:  EKG is not ordered today.    Recent Labs: 07/31/2017: BUN 14; Creatinine, Ser 1.10; Hemoglobin 13.6; Platelets 259; Potassium 3.8; Sodium 141 08/24/2017: ALT 15  Recent Lipid Panel    Component Value Date/Time   CHOL 106 08/24/2017 1243   TRIG 202 (H) 08/24/2017 1243   HDL 29 (L) 08/24/2017 1243   CHOLHDL 3.7 08/24/2017 1243   CHOLHDL 3.8 06/20/2015 1154   VLDL 33 (H) 06/20/2015 1154  [place] 37 08/24/2017 1243    Physical Exam:    VS:  BP 106/70   Pulse 73   Ht 5\' 10"  (1.778 m)   Wt 196 lb 6.4 oz (89.1 kg)   BMI 28.18 kg/m     Wt Readings from Last 3 Encounters:  11/09/17 196 lb 6.4 oz (89.1 kg)  09/01/17 195 lb (88.5 kg)  08/24/17 195 lb 1.6 oz (88.5 kg)     GEN:  Well nourished, well developed in no acute distress HEENT: Normal NECK: No JVD. LYMPHATICS: No lymphadenopathy CARDIAC: RRR, no murmur, S4 gallop, trace bilateral ankle and mid shin edema. VASCULAR: 2+ bilateral radial and posterior tibial pulses.  No bruits. RESPIRATORY:  Clear to auscultation without rales, wheezing or rhonchi  ABDOMEN: Soft, non-tender, non-distended, No pulsatile mass, MUSCULOSKELETAL: No deformity  SKIN: Warm and dry NEUROLOGIC:  Alert and oriented x 3 PSYCHIATRIC:  Normal affect   ASSESSMENT:    1. Coronary artery disease involving native coronary artery of native heart with angina pectoris ([place])   2. Essential hypertension   3. Tobacco abuse   4. Mixed hyperlipidemia   5. Chronic anticoagulation    PLAN:    In order of problems listed above:  1. He is doing well without angina.  Risk factor modification is currently somewhat compromised by the possibility that statin therapy.  Last LDL was 37.  He was on 80 mg of atorvastatin per day.  This is been discontinued because of an elevated CPK.  This is being  followed by Dr. Inda Merlin. 2. Target blood pressure 130/80 mmHg.  Low-salt diet. 3. Long discussion concerning smoking cessation.  He is not willing to commit. 4. We will see what cholesterol levels are off statin therapy at some point.  With elevated CPK, have to assume that some of his musculoskeletal complaints could be related to myositis from high intensity statin therapy.  5. He is on Plavix monotherapy.  Not on aspirin.  Not sure why we are on Plavix and not aspirin at this point.  Will investigate the chart.  Overall, he is at risk for future cardiac events given continued smoking.  He also has family history.  He is not getting exercise because of musculoskeletal complaints and hip discomfort.  With elevated CPK I agree that the statin therapy could be contributing and feel he should be off high intensity statins for several months.  Lipids will need to be rechecked.  CPK needs to normalize.  If LDL cholesterol on no therapy is markedly elevated above 100, he may need to consider PCSK9 therapy.  Overall clinical follow-up in 9 to 12 months.  I encouraged activity.  Counseling concerning risk prevention and education concerning natural history of disease process required greater than 50% of the time for this extended office visit.   Medication Adjustments/Labs and Tests Ordered: Current medicines are reviewed at length with the patient today.  Concerns regarding medicines are outlined above.  No orders of the defined types were placed in this encounter.  No orders of the defined types were placed in this encounter.   There are no Patient Instructions on file for this visit.   Signed, Sinclair Grooms, MD  11/09/2017 11:54 AM    [place]

## 2017-11-09 ENCOUNTER — Encounter: Payer: Self-pay | Admitting: Interventional Cardiology

## 2017-11-09 ENCOUNTER — Ambulatory Visit (INDEPENDENT_AMBULATORY_CARE_PROVIDER_SITE_OTHER): Payer: Medicaid Other | Admitting: Interventional Cardiology

## 2017-11-09 VITALS — BP 106/70 | HR 73 | Ht 70.0 in | Wt 196.4 lb

## 2017-11-09 DIAGNOSIS — I25119 Atherosclerotic heart disease of native coronary artery with unspecified angina pectoris: Secondary | ICD-10-CM | POA: Diagnosis not present

## 2017-11-09 DIAGNOSIS — I10 Essential (primary) hypertension: Secondary | ICD-10-CM

## 2017-11-09 DIAGNOSIS — E782 Mixed hyperlipidemia: Secondary | ICD-10-CM

## 2017-11-09 DIAGNOSIS — Z7901 Long term (current) use of anticoagulants: Secondary | ICD-10-CM

## 2017-11-09 DIAGNOSIS — Z720 Tobacco use: Secondary | ICD-10-CM

## 2017-11-09 MED ORDER — METOPROLOL TARTRATE 25 MG PO TABS: 25.0000 mg | ORAL_TABLET | Freq: Two times a day (BID) | ORAL | 3 refills | Status: DC

## 2017-11-09 NOTE — Patient Instructions (Signed)
Medication Instructions:  1) CONTINUE Metoprolol Tartrate 25mg  twice daily  If you need a refill on your cardiac medications before your next appointment, please call your pharmacy.   Lab work: None If you have labs (blood work) drawn today and your tests are completely normal, you will receive your results only by: Marland Kitchen MyChart Message (if you have MyChart) OR . A paper copy in the mail If you have any lab test that is abnormal or we need to change your treatment, we will call you to review the results.  Testing/Procedures: None  Follow-Up: At [facility], you and your health needs are our priority.  As part of our continuing mission to provide you with exceptional heart care, we have created designated Provider Care Teams.  These Care Teams include your primary Cardiologist (physician) and Advanced Practice Providers (APPs -  Physician Assistants and Nurse Practitioners) who all work together to provide you with the care you need, when you need it. You will need a follow up appointment in 12 months.  Please call our office 2 months in advance to schedule this appointment.  You may see Dr. Tamala Julian or one of the following Advanced Practice Providers on your designated Care Team:   Truitt Merle, NP Cecilie Kicks, NP . Kathyrn Drown, NP  Any Other Special Instructions Will Be Listed Below (If Applicable).   Smoking Tobacco Information Smoking tobacco will very likely harm your health. Tobacco contains a poisonous (toxic), colorless chemical called nicotine. Nicotine affects the brain and makes tobacco addictive. This change in your brain can make it hard to stop smoking. Tobacco also has other toxic chemicals that can hurt your body and raise your risk of many cancers. How can smoking tobacco affect me? Smoking tobacco can increase your chances of having serious health conditions, such as:  Cancer. Smoking is most commonly associated with lung cancer, but can lead to cancer in other parts  of the body.  Chronic obstructive pulmonary disease (COPD). This is a long-term lung condition that makes it hard to breathe. It also gets worse over time.  High blood pressure (hypertension), heart disease, stroke, or heart attack.  Lung infections, such as pneumonia.  Cataracts. This is when the lenses in the eyes become clouded.  Digestive problems. This may include peptic ulcers, heartburn, and gastroesophageal reflux disease (GERD).  Oral health problems, such as gum disease and tooth loss.  Loss of taste and smell.  Smoking can affect your appearance by causing:  Wrinkles.  Yellow or stained teeth, fingers, and fingernails.  Smoking tobacco can also affect your social life.  Many workplaces, [employer], hotels, and public places are tobacco-free. This means that you may experience challenges in finding places to smoke when away from home.  The cost of a smoking habit can be expensive. Expenses for someone who smokes come in two ways: ? You spend money on a regular basis to buy tobacco. ? Your health care costs in the long-term are higher if you smoke.  Tobacco smoke can also affect the health of those around you. Children of smokers have greater chances of: ? Sudden infant death syndrome (SIDS). ? Ear infections. ? Lung infections.  What lifestyle changes can be made?  Do not start smoking. Quit if you already do.  To quit smoking: ? Make a plan to quit smoking and commit yourself to it. Look for programs to help you and ask your health care provider for recommendations and ideas. ? Talk with your health care provider  about using nicotine replacement medicines to help you quit. Medicine replacement medicines include gum, lozenges, patches, sprays, or pills. ? Do not replace cigarette smoking with electronic cigarettes, which are commonly called e-cigarettes. The safety of e-cigarettes is not known, and some may contain harmful chemicals. ? Avoid places, people, or  situations that tempt you to smoke. ? If you try to quit but return to smoking, don't give up hope. It is very common for people to try a number of times before they fully succeed. When you feel ready again, give it another try.  Quitting smoking might affect the way you eat as well as your weight. Be prepared to monitor your eating habits. Get support in planning and following a healthy diet.  Ask your health care provider about having regular tests (screenings) to check for cancer. This may include blood tests, imaging tests, and other tests.  Exercise regularly. Consider taking walks, joining a gym, or doing yoga or exercise classes.  Develop skills to manage your stress. These skills include meditation. What are the benefits of quitting smoking? By quitting smoking, you may:  Lower your risk of getting cancer and other diseases caused by smoking.  Live longer.  Breathe better.  Lower your blood pressure and heart rate.  Stop your addiction to tobacco.  Stop creating secondhand smoke that hurts other people.  Improve your sense of taste and smell.  Look better over time, due to having fewer wrinkles and less staining.  What can happen if changes are not made? If you do not stop smoking, you may:  Get cancer and other diseases.  Develop COPD or other long-term (chronic) lung conditions.  Develop serious problems with your heart and blood vessels (cardiovascular system).  Need more tests to screen for problems caused by smoking.  Have higher, long-term healthcare costs from medicines or treatments related to smoking.  Continue to have worsening changes in your lungs, mouth, and nose.  Where to find support: To get support to quit smoking, consider:  Asking your health care provider for more information and resources.  Taking classes to learn more about quitting smoking.  Looking for local organizations that offer resources about quitting smoking.  Joining a  support group for people who want to quit smoking in your local community.  Where to find more information: You may find more information about quitting smoking from:  HelpGuide.org: www.helpguide.org/articles/addictions/how-to-quit-smoking.htm  https://hall.com/: smokefree.gov  American Lung Association: www.lung.org  Contact a health care provider if:  You have problems breathing.  Your lips, nose, or fingers turn blue.  You have chest pain.  You are coughing up blood.  You feel faint or you pass out.  You have other noticeable changes that cause you to worry. Summary  Smoking tobacco can negatively affect your health, the health of those around you, your finances, and your social life.  Do not start smoking. Quit if you already do. If you need help quitting, ask your health care provider.  Think about joining a support group for people who want to quit smoking in your local community. There are many effective programs that will help you to quit this behavior. This information is not intended to replace advice given to you by your health care provider. Make sure you discuss any questions you have with your health care provider. Document Released: 01/21/2016 Document Revised: 01/21/2016 Document Reviewed: 01/21/2016 Elsevier Interactive Patient Education  [employer].

## 2017-11-17 ENCOUNTER — Other Ambulatory Visit: Payer: Self-pay | Admitting: Nurse Practitioner

## 2017-11-17 DIAGNOSIS — M25562 Pain in left knee: Secondary | ICD-10-CM

## 2017-11-19 ENCOUNTER — Ambulatory Visit
Admission: RE | Admit: 2017-11-19 | Discharge: 2017-11-19 | Disposition: A | Discharge status: Home / Self Care | Payer: Medicaid Other | Source: Ambulatory Visit | Attending: Nurse Practitioner | Admitting: Nurse Practitioner

## 2017-11-19 ENCOUNTER — Other Ambulatory Visit: Payer: Self-pay | Admitting: Nurse Practitioner

## 2017-11-19 DIAGNOSIS — M25562 Pain in left knee: Secondary | ICD-10-CM

## 2017-11-19 DIAGNOSIS — C642 Malignant neoplasm of left kidney, except renal pelvis: Secondary | ICD-10-CM

## 2018-01-20 NOTE — Progress Notes (Signed)
Office Visit Note  Patient: Damon Smith             Date of Birth: 08-20-1965           MRN: [id number]             PCP: Josetta Huddle, MD Referring: Dianna Rossetti, NP Visit Date: 02/02/2018 Occupation: Unemployed, electrician  Subjective:  Pain in multiple joints and muscles   History of Present Illness: Damon Smith is a 53 y.o. male seen in consultation per request of Dr. Inda Merlin.  According to patient his symptoms are started with left knee joint pain and swelling in 2014.  He states his left knee joint has been popping and hurting since then.  He did not have any insurance and got insurance recently.  He had x-ray of his left knee joint which was consistent with mild osteoarthritis.  He states the CT scan of his abdomen also showed osteoarthritis in his left hip joint.  He has been having pain and discomfort in his left hip.  He was diagnosed with renal cell cancer in May 2018 and underwent left nephrectomy.  He states since the surgery he has been experiencing increased joint pain and increased muscle pain.  He has been also is having issues with memory loss.  He describes severe pain and discomfort in his left hip.  He also has pain in his neck, thoracic region and lower back.  He states the pain from the back radiates into his left lower extremity.  He was also told that he had elevated CK while he was having pain in his calves.  He states the pain in his calves that started after the surgery.  He has been off his statins for the last 2 months without improvement in the CK levels.  He denies any muscle weakness.  Activities of Daily Living:  Patient reports morning stiffness for 3 hours.   Patient Reports nocturnal pain.  Difficulty dressing/grooming: Reports Difficulty climbing stairs: Reports Difficulty getting out of chair: Reports Difficulty using hands for taps, buttons, cutlery, and/or writing: Denies  Review of Systems  Constitutional: Positive for fatigue.  Negative for night sweats.  HENT: Negative for mouth sores, mouth dryness and nose dryness.   Eyes: Positive for dryness. Negative for redness.  Respiratory: Negative for shortness of breath and difficulty breathing.   Cardiovascular: Negative for chest pain, palpitations, hypertension, irregular heartbeat and swelling in legs/feet.  Gastrointestinal: Negative for blood in stool, constipation and diarrhea.  Endocrine: Negative for increased urination.  Musculoskeletal: Positive for arthralgias, joint pain, myalgias, morning stiffness and myalgias. Negative for joint swelling, muscle weakness and muscle tenderness.  Skin: Negative for color change, rash, hair loss, nodules/bumps, skin tightness, ulcers and sensitivity to sunlight.  Allergic/Immunologic: Negative for susceptible to infections.  Neurological: Negative for dizziness, fainting, memory loss, night sweats and weakness ( ).  Hematological: Negative for swollen glands.  Psychiatric/Behavioral: Positive for depressed mood and suicidal ideas. Negative for sleep disturbance. The patient is nervous/anxious.     PMFS History:  Patient Active Problem List   Diagnosis Date Noted  . Chronically on opiate therapy 09/13/2017  . Incarcerated femoral hernia (fat/lymph node) 07/31/2017  . Left inguinal hernia 07/31/2017  . Arthritis 07/31/2017  . Renal cell carcinoma of left kidney s/p radical nephrectomy 2018 07/31/2017  . History of financial difficulties 07/31/2017  . Chronic anticoagulation 07/31/2017  . Umbilical hernia 47/65/4650  . Hyperlipidemia 06/20/2015  . Sleep disturbance 06/20/2015  . Left hip  pain 06/20/2015  . Anxiety and depression 10/23/2014  . Coronary artery disease involving native coronary artery of native heart with angina pectoris ([place]) 10/17/2014  . Presence of DES in RCA : Synergy 4.0 mm x 28 mm (4.5 mm) 10/17/2014  . Essential hypertension 10/17/2014  . Tobacco abuse 10/16/2014  . Chronic pain 10/16/2014      Past Medical History:  Diagnosis Date  . Chronic pain 10/16/2014   On Narcotics @ home.  . Coronary artery disease involving native heart with unstable angina pectoris ([place]) 10/17/2014   LM ~45%, mRCA 99% (DES PCI), RPL3 70%, mLAD 50% & ostial D1 70%, OM1 75%  . Essential hypertension 10/17/2014  . Hyperlipidemia with target LDL less than 70 10/16/2014  . Presence of DES in RCA : Synergy 4.0 mm x 28 mm (4.5 mm) 10/17/2014  . ST elevation myocardial infarction (STEMI) of inferior wall, initial episode of care ([place]) 10/16/2014  . Tobacco abuse 10/16/2014    Family History  Problem Relation Age of Onset  . Heart attack Father 60  . Hyperlipidemia Father   . Hypertension Father   . Healthy Daughter    Past Surgical History:  Procedure Laterality Date  . CARDIAC CATHETERIZATION N/A 10/16/2014   Procedure: Left Heart Cath and Coronary Angiography;  Surgeon: Belva Crome, MD;  Location: [facility];  Service: Cardiovascular;  LM ~45%, mRCA 99% (DES PCI), RPL3 70%, mLAD 50% & ostial D1 70%, OM1 75%  . CARDIAC CATHETERIZATION N/A 10/16/2014   Procedure: Coronary Stent Intervention;  Surgeon: Belva Crome, MD;  Location: [facility];  Service: Cardiovascular;  mRCA 99% --> Synergy DES 4.0 mm x 28 mm (4.5 mm)  . knee pain    . NEPHRECTOMY Left 2018  . NO PAST SURGERIES    . TRANSTHORACIC ECHOCARDIOGRAM     Social History   Social History Narrative  . Not on file    Objective: Vital Signs: BP 129/81 (BP Location: Right Arm, Patient Position: Sitting, Cuff Size: Normal)   Pulse 80   Resp 15   Ht '5\' 9"'$  (1.753 m)   Wt 190 lb 3.2 oz (86.3 kg)   BMI 28.09 kg/m    Physical Exam Vitals signs and nursing note reviewed.  Constitutional:      Appearance: He is well-developed.  HENT:     Head: Normocephalic and atraumatic.  Eyes:     Conjunctiva/sclera: Conjunctivae normal.     Pupils: Pupils are equal, round, and reactive to light.  Neck:     Musculoskeletal: Normal range of  motion and neck supple.  Cardiovascular:     Rate and Rhythm: Normal rate and regular rhythm.     Heart sounds: Normal heart sounds.  Pulmonary:     Effort: Pulmonary effort is normal.     Breath sounds: Normal breath sounds.  Abdominal:     General: Bowel sounds are normal.     Palpations: Abdomen is soft.  Skin:    General: Skin is warm and dry.     Capillary Refill: Capillary refill takes less than 2 seconds.  Neurological:     Mental Status: He is alert and oriented to person, place, and time.  Psychiatric:        Behavior: Behavior normal.      Musculoskeletal Exam: Painful limited left lateral rotation of the C-spine.  Limited range of motion of thoracic and lumbar spine.  Tenderness on palpation over lower thoracic and lumbar region.  Shoulder joints  abduction limited to 90 degrees due to C-spine pain.  Elbow joints wrist joints with good range of motion.  Mild DIP and PIP thickening in the hands was noted.  Left hip joint limited range of motion with discomfort.  Left knee joint with crepitus without any warmth swelling or effusion.  Ankle joints MTPs PIPs with good range of motion with no synovitis.  He had good muscle strength in all extremities and DTRs were normal.  He had difficulty getting up from the chair due to left hip pain and lower back pain.  CDAI Exam: CDAI Score: Not documented Patient Global Assessment: Not documented; Provider Global Assessment: Not documented Swollen: Not documented; Tender: Not documented Joint Exam   Not documented   There is currently no information documented on the homunculus. Go to the Rheumatology activity and complete the homunculus joint exam.  Investigation: Findings:  10/20/17: CBC and CMP WNL,CK 709, Sed rate 7, CRP 6, uric acid 7.2   Imaging: Xr Hip Unilat W Or W/o Pelvis 2-3 Views Left  Result Date: 02/02/2018 Severe superior lateral narrowing was noted.  Sclerosis around joint and subchondral cystic changes were noted.   Inferior spurring was noted.  SI joints were not well visualized. Impression: These findings are consistent with severe osteoarthritis of the left hip joint.  Xr Cervical Spine 2 Or 3 Views  Result Date: 02/02/2018 Multilevel spondylosis was noted.  C3-4 and C6-7 narrowing with anterior spurring was noted.  C4-5 mild anterolisthesis was noted.  Mild facet joint arthropathy was noted. Impression: These findings are consistent with multilevel spondylosis and facet joint arthropathy.  Xr Lumbar Spine 2-3 Views  Result Date: 02/02/2018 No significant disc space narrowing was noted.  Facet joint arthropathy was noted.  Calcification of the aorta was noted. Impression: These findings are consistent with facet joint arthropathy of the lumbar spine.   Recent Labs: Lab Results  Component Value Date   WBC 10.7 (H) 07/31/2017   HGB 13.6 07/31/2017   PLT 259 07/31/2017   NA 141 07/31/2017   K 3.8 07/31/2017   CL 104 07/31/2017   CO2 26 07/31/2017   GLUCOSE 93 07/31/2017   BUN 14 07/31/2017   CREATININE 1.10 07/31/2017   BILITOT 0.5 08/24/2017   ALKPHOS 66 08/24/2017   AST 11 08/24/2017   ALT 15 08/24/2017   PROT 7.1 08/24/2017   ALBUMIN 4.5 08/24/2017   CALCIUM 9.2 07/31/2017   GFRAA >60 07/31/2017    Speciality Comments: No specialty comments available.  Procedures:  No procedures performed Allergies: Patient has no known allergies.   Assessment / Plan:     Visit Diagnoses: Myalgia -patient continues to have ongoing pain and discomfort in his muscles.  He states pain in the muscles is episodic.  Recurrent myalgia despite discontinuing lipitor.  He has been off Lipitor for 2 months now.  He has not noticed any improvement in myalgias.  Elevated CK - 10/20/17: CBC and CMP WNL,CK 709, Sed rate 7, CRP 6, uric acid 7.2.  I will obtain CK TSH aldolase, ANA and myositis panel today.  Pain in left hip -he has limited painful range of motion of his left hip joint.  Plan: XR HIP UNILAT W OR  W/O PELVIS 2-3 VIEWS LEFT.  The hip joint x-ray showed severe osteoarthritis.  He would benefit from total hip replacement.  We had detailed discussion regarding.  He would like to discuss that further with Dr. Inda Merlin.  Neck pain -he had painful range of motion of  his cervical spine.  Plan: XR Cervical Spine 2 or 3 views.  The cervical spine x-ray showed multilevel spondylosis and facet joint arthropathy.  He also has some spondylolisthesis.  He has been having left-sided radiculopathy.  I will schedule MRI of his C-spine to evaluate this further.  Chronic midline low back pain with left-sided sciatica -he has limited painful range of motion of his lumbar spine.  He describes left-sided radiculopathy.  Plan: XR Lumbar Spine 2-3 Views.  The lumbar spine x-ray was consistent with mild facet joint arthropathy.  No significant disc space narrowing was noted.  Chronic pain syndrome - tramadol 50 mg 1 tablet every 6hours as needed   History of renal cell cancer - Left 05/2016, s/p surgical resection. no chemo  History of anxiety  Other insomnia  Essential hypertension-his blood pressure is controlled.  History of coronary artery disease  History of MI (myocardial infarction)  Chronic anticoagulation  Smoker - 1 ppd for 35 years   Orders: Orders Placed This Encounter  Procedures  . XR Cervical Spine 2 or 3 views  . XR Lumbar Spine 2-3 Views  . XR HIP UNILAT W OR W/O PELVIS 2-3 VIEWS LEFT  . Urinalysis, Routine w reflex microscopic  . CK  . TSH  . Aldolase  . Myositis Assessr Plus Jo-1 Autoabs  . Hepatitis B core antibody, IgM  . Hepatitis B surface antigen  . Hepatitis C antibody  . Serum protein electrophoresis with reflex  . IgG, IgA, IgM  . ANA  . Anti-HMGCR Ab IgG  . Rheumatoid factor  . Cyclic citrul peptide antibody, IgG  . HLA-B27 antigen   No orders of the defined types were placed in this encounter.   Face-to-face time spent with patient was 50 minutes. Greater than  50% of time was spent in counseling and coordination of care.  Follow-Up Instructions: Return for Elevated CK and joint pain.   Bo Merino, MD  Note - This record has been created using Editor, commissioning.  Chart creation errors have been sought, but may not always  have been located. Such creation errors do not reflect on  the standard of medical care.

## 2018-01-25 ENCOUNTER — Encounter: Payer: Self-pay | Admitting: Physical Medicine & Rehabilitation

## 2018-01-25 ENCOUNTER — Telehealth: Payer: Self-pay | Admitting: Physical Medicine & Rehabilitation

## 2018-02-02 ENCOUNTER — Ambulatory Visit (INDEPENDENT_AMBULATORY_CARE_PROVIDER_SITE_OTHER): Payer: Medicaid Other

## 2018-02-02 ENCOUNTER — Encounter: Payer: Self-pay | Admitting: Rheumatology

## 2018-02-02 ENCOUNTER — Ambulatory Visit: Payer: Medicaid Other | Admitting: Rheumatology

## 2018-02-02 VITALS — BP 129/81 | HR 80 | Resp 15 | Ht 69.0 in | Wt 190.2 lb

## 2018-02-02 DIAGNOSIS — G8929 Other chronic pain: Secondary | ICD-10-CM

## 2018-02-02 DIAGNOSIS — M791 Myalgia, unspecified site: Secondary | ICD-10-CM

## 2018-02-02 DIAGNOSIS — Z7901 Long term (current) use of anticoagulants: Secondary | ICD-10-CM

## 2018-02-02 DIAGNOSIS — I252 Old myocardial infarction: Secondary | ICD-10-CM

## 2018-02-02 DIAGNOSIS — M5442 Lumbago with sciatica, left side: Secondary | ICD-10-CM | POA: Diagnosis not present

## 2018-02-02 DIAGNOSIS — M542 Cervicalgia: Secondary | ICD-10-CM

## 2018-02-02 DIAGNOSIS — M25552 Pain in left hip: Secondary | ICD-10-CM | POA: Diagnosis not present

## 2018-02-02 DIAGNOSIS — R748 Abnormal levels of other serum enzymes: Secondary | ICD-10-CM

## 2018-02-02 DIAGNOSIS — R5383 Other fatigue: Secondary | ICD-10-CM

## 2018-02-02 DIAGNOSIS — G4709 Other insomnia: Secondary | ICD-10-CM

## 2018-02-02 DIAGNOSIS — Z8659 Personal history of other mental and behavioral disorders: Secondary | ICD-10-CM

## 2018-02-02 DIAGNOSIS — I10 Essential (primary) hypertension: Secondary | ICD-10-CM

## 2018-02-02 DIAGNOSIS — F17200 Nicotine dependence, unspecified, uncomplicated: Secondary | ICD-10-CM

## 2018-02-02 DIAGNOSIS — Z85528 Personal history of other malignant neoplasm of kidney: Secondary | ICD-10-CM

## 2018-02-02 DIAGNOSIS — Z8679 Personal history of other diseases of the circulatory system: Secondary | ICD-10-CM

## 2018-02-02 DIAGNOSIS — G894 Chronic pain syndrome: Secondary | ICD-10-CM

## 2018-02-10 LAB — MYOSITIS ASSESSR PLUS JO-1 AUTOABS
EJ Autoabs: NOT DETECTED
Jo-1 Autoabs: <1 AI
KU AUTOABS: NOT DETECTED
Mi-2 Autoabs: NOT DETECTED
OJ Autoabs: NOT DETECTED
PL-12 Autoabs: NOT DETECTED
PL-7 Autoabs: NOT DETECTED
SRP Autoabs: NOT DETECTED

## 2018-02-10 LAB — HEPATITIS C ANTIBODY
Hepatitis C Ab: NONREACTIVE
SIGNAL TO CUT-OFF: 0.03 (ref <1.00)

## 2018-02-10 LAB — URINALYSIS, ROUTINE W REFLEX MICROSCOPIC
Bilirubin Urine: NEGATIVE
GLUCOSE, UA: NEGATIVE
Hgb urine dipstick: NEGATIVE
Ketones, ur: NEGATIVE
Leukocytes, UA: NEGATIVE
Nitrite: NEGATIVE
Protein, ur: NEGATIVE
Specific Gravity, Urine: 1.029 (ref 1.001–1.03)
pH: 5.5 (ref 5.0–8.0)

## 2018-02-10 LAB — CK: Total CK: 49 U/L (ref 44–196)

## 2018-02-10 LAB — ANTI-HMGCR AB IGG: ANTI HMGCR AB IGG: <20 Units (ref <20)

## 2018-02-10 LAB — RHEUMATOID FACTOR: Rheumatoid fact SerPl-aCnc: <14 IU/mL (ref <14)

## 2018-02-10 LAB — PROTEIN ELECTROPHORESIS, SERUM, WITH REFLEX
ALPHA 2: 0.8 g/dL (ref 0.5–0.9)
Albumin ELP: 5 g/dL — ABNORMAL HIGH (ref 3.8–4.8)
Alpha 1: 0.3 g/dL (ref 0.2–0.3)
BETA GLOBULIN: 0.5 g/dL (ref 0.4–0.6)
Beta 2: 0.5 g/dL (ref 0.2–0.5)
Gamma Globulin: 0.8 g/dL (ref 0.8–1.7)
Total Protein: 7.9 g/dL (ref 6.1–8.1)

## 2018-02-10 LAB — HLA-B27 ANTIGEN: HLA-B27 Antigen: NEGATIVE

## 2018-02-10 LAB — HEPATITIS B SURFACE ANTIGEN: Hepatitis B Surface Ag: NONREACTIVE

## 2018-02-10 LAB — ANA: Anti Nuclear Antibody(ANA): NEGATIVE

## 2018-02-10 LAB — ALDOLASE: Aldolase: 3.6 U/L (ref <=8.1)

## 2018-02-10 LAB — IGG, IGA, IGM
IgG (Immunoglobin G), Serum: 852 mg/dL (ref 600–1640)
IgM, Serum: 59 mg/dL (ref 50–300)
Immunoglobulin A: 383 mg/dL — ABNORMAL HIGH (ref 47–310)

## 2018-02-10 LAB — CYCLIC CITRUL PEPTIDE ANTIBODY, IGG: Cyclic Citrullin Peptide Ab: <16 UNITS

## 2018-02-10 LAB — HEPATITIS B CORE ANTIBODY, IGM: Hep B C IgM: NONREACTIVE

## 2018-02-10 LAB — TSH: TSH: 1.43 mIU/L (ref 0.40–4.50)

## 2018-02-10 NOTE — Progress Notes (Signed)
Repeat CK is normal.  All autoimmune work-up is normal.  Please notify patient and cancel follow-up visit.  Please forward labs to his PCP.

## 2018-02-11 ENCOUNTER — Telehealth: Payer: Self-pay | Admitting: Rheumatology

## 2018-02-11 NOTE — Telephone Encounter (Signed)
Patient will go have the C-Spine Mri and then come for follow up. Patient advise we will address the back after that. Patient verbalized understanding.

## 2018-02-11 NOTE — Telephone Encounter (Signed)
Attempted to contact the patient and left message for patient to call the office.  

## 2018-02-11 NOTE — Telephone Encounter (Signed)
Patient states when he was here he mentioned pain with his mid back also. Mri is only scheduled for Cspine. Patient wants to know if MRI should be scheduled for Mid back also. Patient is confused why he does not need to come back here for a return visit since he is still having a lot of back pain, and doctor did not take xrays of his back, just his neck. Please call patient to advise.

## 2018-02-14 ENCOUNTER — Encounter: Payer: Medicaid Other | Attending: Physical Medicine & Rehabilitation

## 2018-02-14 ENCOUNTER — Ambulatory Visit: Payer: Medicaid Other | Admitting: Physical Medicine & Rehabilitation

## 2018-02-14 ENCOUNTER — Ambulatory Visit (HOSPITAL_COMMUNITY): Payer: Medicaid Other

## 2018-02-14 ENCOUNTER — Encounter: Payer: Self-pay | Admitting: Physical Medicine & Rehabilitation

## 2018-02-14 VITALS — BP 132/93 | HR 78 | Ht 70.0 in | Wt 197.0 lb

## 2018-02-14 DIAGNOSIS — Z79891 Long term (current) use of opiate analgesic: Secondary | ICD-10-CM

## 2018-02-14 DIAGNOSIS — M47812 Spondylosis without myelopathy or radiculopathy, cervical region: Secondary | ICD-10-CM | POA: Insufficient documentation

## 2018-02-14 DIAGNOSIS — M1612 Unilateral primary osteoarthritis, left hip: Secondary | ICD-10-CM

## 2018-02-14 NOTE — Patient Instructions (Signed)
Recommend seeing an orthopedic surgeon for your left hip and depending on your MRI results for your neck as well.  Please cause if you have residual issues post operatively

## 2018-02-14 NOTE — Progress Notes (Signed)
Subjective:    Patient ID: Damon Smith, male    DOB: [birth year], 53 y.o.   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 General activity 8 Relation with others 9 Enjoyment of life 9 What TIME of day is your pain at its worst? morning , daytime, night Sleep (in general) Poor  Pain is worse with: walking, bending, sitting and standing Pain improves with: medication Relief from Meds: 7  Mobility walk without assistance how many minutes can you walk? 20 ability to climb steps?  yes do you drive?  yes Do you have any goals in this area?  yes  Function not employed: date last employed . disabled: date disabled .  Neuro/Psych numbness tingling trouble walking depression anxiety  Prior Studies new No significant disc space narrowing was noted.  Facet joint arthropathy  was noted.  Calcification of the aorta was noted.  Impression: These findings are consistent with facet joint arthropathy of  the lumbar spine.  Severe superior lateral narrowing was noted.  Sclerosis around joint and  subchondral cystic changes were noted.  Inferior spurring was noted.  SI  joints were not well  visualized.  Impression: These findings are consistent with severe osteoarthritis of  the left hip joint.  Multilevel spondylosis was noted.  C3-4 and C6-7 narrowing with anterior  spurring was noted.  C4-5 mild anterolisthesis was noted.  Mild facet  joint arthropathy was noted.  Impression: These findings are consistent with multilevel spondylosis and  facet joint arthropathy.  CLINICAL DATA:  Chronic Lt knee pain and swelling in Lt knee, no known injury, no surg   EXAM: LEFT KNEE - COMPLETE 4+ VIEW   COMPARISON:  None.   FINDINGS: There is mild narrowing of the MEDIAL compartment. No acute fracture or subluxation. No joint effusion.   IMPRESSION: Mild degenerative changes.  No evidence for acute  abnormality.     Electronically Signed   By: Nolon Nations M.D.   On: 11/19/2017 16:34  Physicians involved in your care new   Family History  Problem Relation Age of Onset  . Heart attack Father 55  . Hyperlipidemia Father   . Hypertension Father   . Healthy Daughter    Social History   Socioeconomic History  . Marital status: Single    Spouse name: Not on file  . Number of children: Not on file  . Years of education: Not on file  . Highest education level: Not on file  Occupational History  . Not on file  Social Needs  . Financial resource strain: Not on file  . Food insecurity:    Worry: Not on file    Inability:  Not on file  . Transportation needs:    Medical: Not on file    Non-medical: Not on file  Tobacco Use  . Smoking status: Current Every Day Smoker    Packs/day: 2.00  . Smokeless tobacco: Never Used  . Tobacco comment: 2 ppd  Substance and Sexual Activity  . Alcohol use: No    Alcohol/week: 0.0 standard drinks  . Drug use: No  . Sexual activity: Not on file  Lifestyle  . Physical activity:    Days per week: Not on file    Minutes per session: Not on file  . Stress: Not on file  Relationships  . Social connections:    Talks on phone:  Not on file    Gets together: Not on file    Attends religious service: Not on file    Active member of club or organization: Not on file    Attends meetings of clubs or organizations: Not on file    Relationship status: Not on file  Other Topics Concern  . Not on file  Social History Narrative  . Not on file   Past Surgical History:  Procedure Laterality Date  . CARDIAC CATHETERIZATION N/A 10/16/2014   Procedure: Left Heart Cath and Coronary Angiography;  Surgeon: Belva Crome, MD;  Location: [facility];  Service: Cardiovascular;  LM ~45%, mRCA 99% (DES PCI), RPL3 70%, mLAD 50% & ostial D1 70%, OM1 75%  . CARDIAC CATHETERIZATION N/A 10/16/2014   Procedure: Coronary Stent Intervention;  Surgeon: Belva Crome, MD;  Location: [facility];  Service: Cardiovascular;  mRCA 99% --> Synergy DES 4.0 mm x 28 mm (4.5 mm)  . knee pain    . NEPHRECTOMY Left 2018  . NO PAST SURGERIES    . TRANSTHORACIC ECHOCARDIOGRAM     Past Medical History:  Diagnosis Date  . Chronic pain 10/16/2014   On Narcotics @ home.  . 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 Essential hypertension 10/17/2014  . Hyperlipidemia with target LDL less than 70 10/16/2014  . Presence of DES in RCA : Synergy 4.0 mm x 28 mm (4.5 mm) 10/17/2014  . ST elevation myocardial infarction (STEMI) of inferior wall, initial episode of care ([place]) 10/16/2014  . Tobacco abuse 10/16/2014   There were no vitals taken for this visit.  Opioid Risk Score:   Fall Risk Score:  `1  Depression screen PHQ 2/9  Depression screen [facility] 2/9 02/14/2018 10/23/2014 10/23/2014  Decreased Interest 1 2 2   Down, Depressed, Hopeless 1 2 2   PHQ - 2 Score 2 4 4   Altered sleeping 2 2 -  Tired, decreased energy 1 2 -  Change in appetite 2 2 -  Feeling bad or failure about yourself  0 2 -  Trouble concentrating 1 1 -    Moving slowly or fidgety/restless 0 0 -  Suicidal thoughts 0 0 -  PHQ-9 Score 8 13 -  Difficult doing work/chores Somewhat difficult - -     Review of Systems  Constitutional: Positive for appetite change.  HENT: Negative.   Eyes: Negative.   Respiratory: Negative.   Cardiovascular: Positive for leg swelling.  Endocrine: Negative.   Genitourinary: Negative.   Musculoskeletal: Positive for arthralgias, back pain, gait problem, neck pain and neck stiffness.  Skin: Negative.   Allergic/Immunologic: Negative.   Neurological:  Positive for numbness.  Hematological: Negative.   Psychiatric/Behavioral: Positive for dysphoric mood. The patient is nervous/anxious.   All other systems reviewed and are negative.      Objective:   Physical Exam Vitals signs and nursing note reviewed.  Constitutional:      General: He is not in acute distress.    Appearance: Normal appearance. He is normal weight. He is not ill-appearing.  HENT:     Head: Normocephalic and atraumatic.     Mouth/Throat:     Mouth: Mucous membranes are moist.  Neck:     Musculoskeletal: No muscular tenderness.  Cardiovascular:     Rate and Rhythm: Normal rate and regular rhythm.     Heart sounds: Normal heart sounds. No murmur.  Pulmonary:     Effort: Pulmonary effort is normal. No respiratory distress.     Breath sounds: Normal breath sounds. No stridor. No wheezing or rhonchi.  Abdominal:     General: Abdomen is flat. Bowel sounds are normal. There is no distension.     Palpations: Abdomen is soft.     Tenderness: There is no abdominal tenderness.  Musculoskeletal:     Right lower leg: No edema.     Left lower leg: No edema.  Skin:    General: Skin is warm and dry.  Neurological:     General: No focal deficit present.     Mental Status: He is alert and oriented to person, place, and time.     Motor: No tremor, atrophy or abnormal muscle tone.     Coordination: Coordination is intact.     Gait: Gait  abnormal.     Deep Tendon Reflexes:     Reflex Scores:      Tricep reflexes are 1+ on the right side and 1+ on the left side.      Bicep reflexes are 1+ on the right side and 1+ on the left side.      Brachioradialis reflexes are 1+ on the right side and 1+ on the left side.      Patellar reflexes are 2+ on the right side and 2+ on the left side.      Achilles reflexes are 0 on the right side and 0 on the left side.    Comments: Motor strength is 5/5 bilateral deltoid, bicep, tricep, grip, hip flexor, right knee extensor, right ankle dorsiflexor Patient has giveaway weakness at the left knee extensor and the left ankle dorsiflexor. Patient states he cannot extend the great toe on the right side independent of the other toes.  He is able to dorsiflex all toes together on both sides.   Psychiatric:        Mood and Affect: Mood normal.        Behavior: Behavior normal.     Cervical rotation 75% to right , 25% to left 50% ext, 100% flexion,50% lateral bending to both sides   Upper extremity pinprick and light touch are normal  Decreased R L5 pp Decreased Left L3 pp  Patient has good shoulder range of motion bilaterally    Assessment & Plan:  1.  Chronic left shoulder pain this appears to be related to his cervical spine.  He is awaiting a cervical MRI and plans to follow-up with rheumatology on this.  He does not have any signs of myelopathy however his shoulder pain is likely either a referred pain or possibly radicular.  Depending on cervical spine MRI findings he may benefit from neurosurgery or spine  surgery evaluation.  This can be coordinated by rheumatology at [facility]  2.  Chronic left hip pain with severe DJD currently on hydrocodone he takes hydrocodone 10/325 6 tablets/day.  Would not recommend increasing this dose particularly preoperatively as this would complicate postoperative pain management.  Patient's opiate medications are prescribed by primary care  Needs  to be evaluated by orthopedic surgery for  THA, his gait deviation related to his hip contracture is likely causing some of his lumbar spine pain.  Discussed that once his surgery needs are addressed, would be happy to do another physical medicine rehabilitation evaluation. I discussed this with the patient he agrees with this plan.

## 2018-02-17 ENCOUNTER — Ambulatory Visit (HOSPITAL_COMMUNITY): Admission: RE | Admit: 2018-02-17 | Payer: Medicaid Other | Source: Ambulatory Visit

## 2018-02-22 NOTE — Progress Notes (Deleted)
Office Visit Note  Patient: Damon Smith             Date of Birth: 10-15-1965           MRN: [id number]             PCP: Josetta Huddle, MD Referring: Josetta Huddle, MD Visit Date: 03/08/2018 Occupation: _0 @  Subjective:  No chief complaint on file.   History of Present Illness: Damon Smith is a 53 y.o. male ***   Activities of Daily Living:  Patient reports morning stiffness for *** {minute/hour:19697}.   Patient {ACTIONS;DENIES/REPORTS:21021675::"Denies"} nocturnal pain.  Difficulty dressing/grooming: {ACTIONS;DENIES/REPORTS:21021675::"Denies"} Difficulty climbing stairs: {ACTIONS;DENIES/REPORTS:21021675::"Denies"} Difficulty getting out of chair: {ACTIONS;DENIES/REPORTS:21021675::"Denies"} Difficulty using hands for taps, buttons, cutlery, and/or writing: {ACTIONS;DENIES/REPORTS:21021675::"Denies"}  No Rheumatology ROS completed.   PMFS History:  Patient Active Problem List   Diagnosis Date Noted  . Primary osteoarthritis of left hip 02/14/2018  . Cervical spondylosis without myelopathy 02/14/2018  . Chronically on opiate therapy 09/13/2017  . Incarcerated femoral hernia (fat/lymph node) 07/31/2017  . Left inguinal hernia 07/31/2017  . Arthritis 07/31/2017  . Renal cell carcinoma of left kidney s/p radical nephrectomy 2018 07/31/2017  . History of financial difficulties 07/31/2017  . Chronic anticoagulation 07/31/2017  . Umbilical hernia 70/96/2836  . Hyperlipidemia 06/20/2015  . Sleep disturbance 06/20/2015  . Left hip pain 06/20/2015  . Anxiety and depression 10/23/2014  . Coronary artery disease involving native coronary artery of native heart with angina pectoris ([place]) 10/17/2014  . Presence of DES in RCA : Synergy 4.0 mm x 28 mm (4.5 mm) 10/17/2014  . Essential hypertension 10/17/2014  . Tobacco abuse 10/16/2014  . Chronic pain 10/16/2014    Past Medical History:  Diagnosis Date  . Chronic pain 10/16/2014   On Narcotics @ home.  .  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 Essential hypertension 10/17/2014  . Hyperlipidemia with target LDL less than 70 10/16/2014  . Presence of DES in RCA : Synergy 4.0 mm x 28 mm (4.5 mm) 10/17/2014  . ST elevation myocardial infarction (STEMI) of inferior wall, initial episode of care ([place]) 10/16/2014  . Tobacco abuse 10/16/2014    Family History  Problem Relation Age of Onset  . Heart attack Father 68  . Hyperlipidemia Father   . Hypertension Father   . Healthy Daughter    Past Surgical History:  Procedure Laterality Date  . CARDIAC CATHETERIZATION N/A 10/16/2014   Procedure: Left Heart Cath and Coronary Angiography;  Surgeon: Belva Crome, MD;  Location: [facility];  Service: Cardiovascular;  LM ~45%, mRCA 99% (DES PCI), RPL3 70%, mLAD 50% & ostial D1 70%, OM1 75%  . CARDIAC CATHETERIZATION N/A 10/16/2014   Procedure: Coronary Stent Intervention;  Surgeon: Belva Crome, MD;  Location: [facility];  Service: Cardiovascular;  mRCA 99% --> Synergy DES 4.0 mm x 28 mm (4.5 mm)  . knee pain    . NEPHRECTOMY Left 2018  . NO PAST SURGERIES    . TRANSTHORACIC ECHOCARDIOGRAM     Social History   Social History Narrative  . Not on file   Immunization History  Administered Date(s) Administered  . Pneumococcal Polysaccharide-23 10/17/2014     Objective: Vital Signs: There were no vitals taken for this visit.   Physical Exam   Musculoskeletal Exam: ***  CDAI Exam:  CDAI Score: Not documented Patient Global Assessment: Not documented; Provider Global Assessment: Not documented Swollen: Not documented; Tender: Not documented Joint Exam   Not documented   There is currently no information documented on the homunculus. Go to the Rheumatology activity and complete the homunculus joint exam.  Investigation: No additional  findings.  Imaging: Xr Hip Unilat W Or W/o Pelvis 2-3 Views Left  Result Date: 02/02/2018 Severe superior lateral narrowing was noted.  Sclerosis around joint and subchondral cystic changes were noted.  Inferior spurring was noted.  SI joints were not well visualized. Impression: These findings are consistent with severe osteoarthritis of the left hip joint.  Xr Cervical Spine 2 Or 3 Views  Result Date: 02/02/2018 Multilevel spondylosis was noted.  C3-4 and C6-7 narrowing with anterior spurring was noted.  C4-5 mild anterolisthesis was noted.  Mild facet joint arthropathy was noted. Impression: These findings are consistent with multilevel spondylosis and facet joint arthropathy.  Xr Lumbar Spine 2-3 Views  Result Date: 02/02/2018 No significant disc space narrowing was noted.  Facet joint arthropathy was noted.  Calcification of the aorta was noted. Impression: These findings are consistent with facet joint arthropathy of the lumbar spine.   Recent Labs: Lab Results  Component Value Date   WBC 10.7 (H) 07/31/2017   HGB 13.6 07/31/2017   PLT 259 07/31/2017   NA 141 07/31/2017   K 3.8 07/31/2017   CL 104 07/31/2017   CO2 26 07/31/2017   GLUCOSE 93 07/31/2017   BUN 14 07/31/2017   CREATININE 1.10 07/31/2017   BILITOT 0.5 08/24/2017   ALKPHOS 66 08/24/2017   AST 11 08/24/2017   ALT 15 08/24/2017   PROT 7.9 02/02/2018   ALBUMIN 4.5 08/24/2017   CALCIUM 9.2 07/31/2017   GFRAA >60 07/31/2017  UA negative, CK 49, aldolase 3.6, myositis assessment panel negative, HMG coenzyme antibody negative, RF negative, anti-CCP negative, HLA-B27 negative, ANA negative, hepatitis B-, hepatitis C negative, SPEP nonspecific, immunoglobulin IgA mildly elevated  Speciality Comments: No specialty comments available.  Procedures:  No procedures performed Allergies: Patient has no known allergies.   Assessment / Plan:     Visit Diagnoses: No diagnosis found.   Orders: No orders of the defined  types were placed in this encounter.  No orders of the defined types were placed in this encounter.   Face-to-face time spent with patient was *** minutes. Greater than 50% of time was spent in counseling and coordination of care.  Follow-Up Instructions: No follow-ups on file.   Bo Merino, MD  Note - This record has been created using Editor, commissioning.  Chart creation errors have been sought, but may not always  have been located. Such creation errors do not reflect on  the standard of medical care.

## 2018-03-04 ENCOUNTER — Telehealth: Payer: Self-pay | Admitting: Rheumatology

## 2018-03-04 NOTE — Telephone Encounter (Signed)
Damon Smith has spoke with Gabriel Cirri and she will be working on this Monday 03/07/18.

## 2018-03-04 NOTE — Telephone Encounter (Signed)
[place] from [facility] left a voicemail stating patient is scheduled for MRI on 02/1718.  His Medicaid insurance requires authorization for the MRI and his approval expires on 03/05/18.  We were calling to see if you could call Medicaid and get an extension on the authorization.  Please call back at [phone]

## 2018-03-07 ENCOUNTER — Telehealth (INDEPENDENT_AMBULATORY_CARE_PROVIDER_SITE_OTHER): Payer: Self-pay | Admitting: *Deleted

## 2018-03-07 ENCOUNTER — Ambulatory Visit (HOSPITAL_COMMUNITY): Admission: RE | Admit: 2018-03-07 | Payer: Medicaid Other | Source: Ambulatory Visit

## 2018-03-07 ENCOUNTER — Telehealth: Payer: Self-pay | Admitting: Rheumatology

## 2018-03-07 NOTE — Telephone Encounter (Signed)
Pt insurance has expired for him to have his MRI CSP and preservice center is requesting a new one, I called [employer] on Friday and told them this pt Damon Smith will expire on 03/05/18, and will need to extend authorization for todays appointment, they receptionist stated she will let me know the outcome asap.   Today, I checked my vm and [employer] had called stating pt has been inactive with medicaid since 02/17/18, we had to resubmit case for eligibility and will not be able to give authorization today, I am sending clinicals to [employer] with case # [id number].  I emailed April P with centralized scheduling advising of this.

## 2018-03-07 NOTE — Telephone Encounter (Signed)
Patient called stating he had to cancel his MRI on 03/07/18 because it was past his authorization dates.  Patient is checking on the status of the new authorization dates.  Patient will reschedule NPT FU with Dr. Estanislado Pandy once he schedules his MRI per Seth Bake.

## 2018-03-08 ENCOUNTER — Ambulatory Visit: Payer: Self-pay | Admitting: Rheumatology

## 2018-03-08 NOTE — Telephone Encounter (Signed)
IC left vm to return call.

## 2018-03-09 NOTE — Telephone Encounter (Signed)
New case # submitted [id number], requires clinicals again. All pertinent information has been faxed to [phone] for approval. Pending approval from [facility]

## 2018-03-10 NOTE — Telephone Encounter (Signed)
Received authorization from [employer] approval number [id number], valid 03/08/18-04/07/18

## 2018-03-10 NOTE — Telephone Encounter (Signed)
Pt aware of approval and he will contact [facility] and reschedule appt for MRI and then call office to schedule follow up.

## 2018-03-21 ENCOUNTER — Ambulatory Visit (HOSPITAL_COMMUNITY)
Admission: RE | Admit: 2018-03-21 | Discharge: 2018-03-21 | Disposition: A | Discharge status: Home / Self Care | Payer: Medicaid Other | Source: Ambulatory Visit | Attending: Rheumatology | Admitting: Rheumatology

## 2018-03-21 DIAGNOSIS — M542 Cervicalgia: Secondary | ICD-10-CM | POA: Diagnosis not present

## 2018-03-22 ENCOUNTER — Telehealth: Payer: Self-pay | Admitting: *Deleted

## 2018-03-22 DIAGNOSIS — M5030 Other cervical disc degeneration, unspecified cervical region: Secondary | ICD-10-CM

## 2018-03-22 DIAGNOSIS — M4802 Spinal stenosis, cervical region: Secondary | ICD-10-CM

## 2018-03-22 NOTE — Progress Notes (Signed)
Patient has degenerative disc disease with moderate to spinal stenosis.  I would recommend an appointment with neurosurgery for evaluation.

## 2018-03-22 NOTE — Telephone Encounter (Signed)
-----   Message from Bo Merino, MD sent at 03/22/2018  8:18 AM EST ----- Patient has degenerative disc disease with moderate to spinal stenosis.  I would recommend an appointment with neurosurgery for evaluation.

## 2018-06-30 MED FILL — OXYCODONE-APAP 10-325 TAB: 10-325 | 30 days supply | Qty: 150 | Fill #0

## 2018-07-20 MED FILL — HYDROCODON-APAP 10-325: 10-325 | 30 days supply | Qty: 240 | Fill #0

## 2018-08-06 ENCOUNTER — Other Ambulatory Visit: Payer: Self-pay | Admitting: Geriatric Medicine

## 2018-08-06 DIAGNOSIS — C642 Malignant neoplasm of left kidney, except renal pelvis: Secondary | ICD-10-CM

## 2018-08-09 ENCOUNTER — Ambulatory Visit
Admission: RE | Admit: 2018-08-09 | Discharge: 2018-08-09 | Disposition: A | Discharge status: Home / Self Care | Payer: Medicaid Other | Source: Ambulatory Visit | Attending: Geriatric Medicine | Admitting: Geriatric Medicine

## 2018-08-09 DIAGNOSIS — C642 Malignant neoplasm of left kidney, except renal pelvis: Secondary | ICD-10-CM

## 2018-08-09 MED ORDER — IOPAMIDOL (ISOVUE-300) INJECTION 61%
100.0000 mL | Freq: Once | INTRAVENOUS | Status: AC | PRN
  Administered 2018-08-09: 100 mL via INTRAVENOUS

## 2018-08-12 ENCOUNTER — Other Ambulatory Visit: Payer: Self-pay | Admitting: Geriatric Medicine

## 2018-08-12 DIAGNOSIS — R600 Localized edema: Secondary | ICD-10-CM

## 2018-08-16 ENCOUNTER — Ambulatory Visit
Admission: RE | Admit: 2018-08-16 | Discharge: 2018-08-16 | Disposition: A | Discharge status: Home / Self Care | Payer: Medicaid Other | Source: Ambulatory Visit | Attending: Geriatric Medicine | Admitting: Geriatric Medicine

## 2018-08-16 DIAGNOSIS — R600 Localized edema: Secondary | ICD-10-CM

## 2018-08-22 MED FILL — HYDROCODON-APAP 10-325: 10-325 | 30 days supply | Qty: 240 | Fill #0

## 2019-01-11 ENCOUNTER — Telehealth: Payer: Self-pay

## 2019-01-11 ENCOUNTER — Ambulatory Visit: Payer: Medicaid Other | Admitting: Neurology

## 2019-01-11 NOTE — Telephone Encounter (Signed)
Pt no showed with new patient appointment with Dr. Rexene Alberts on 01/11/2019.

## 2019-01-16 ENCOUNTER — Encounter: Payer: Self-pay | Admitting: Neurology

## 2019-05-09 NOTE — Telephone Encounter (Signed)
Close encounter 

## 2019-08-12 IMAGING — CT CT ABD-PELV W/ CM
2 of 5 series · 16 of 46 positions shown, 18 images · IV contrast (ISOVUE)
Comparison: CT abdomen pelvis dated March 31, 2016.

CLINICAL DATA: Right inguinal hernia pain.

EXAM:
CT ABDOMEN AND PELVIS WITH CONTRAST
TECHNIQUE: Multidetector CT imaging of the abdomen and pelvis was performed
using the standard protocol following bolus administration of
intravenous contrast.
CONTRAST:  100mL [id number] IOPAMIDOL (PA5OUQ-M00) INJECTION 61%

[Series 2: axial st · axial · 0.83mm/px · z∈[-556,-81]mm · 13 of 111 slices shown, 15 images]
[im 8/111  soft-tissue]
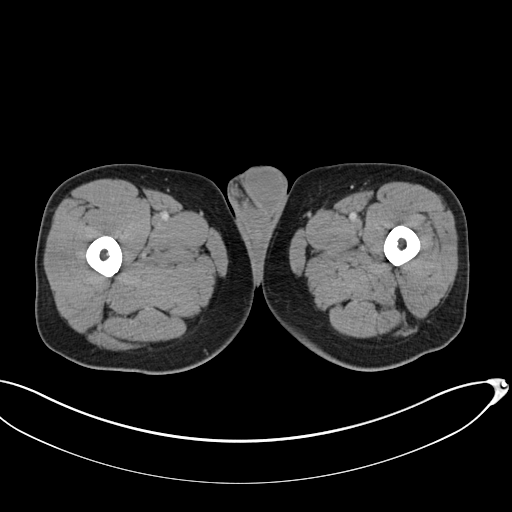
[im 8/111  bone]
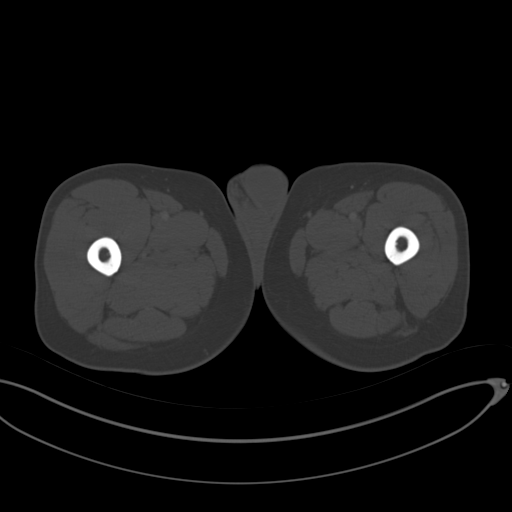
[im 16/111  soft-tissue]
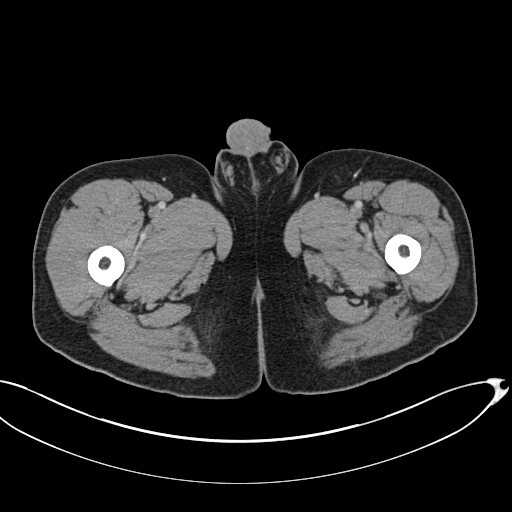
[im 24/111  soft-tissue]
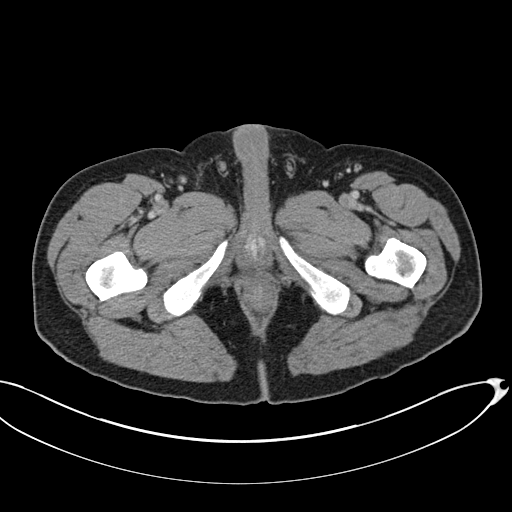
[im 32/111  soft-tissue]
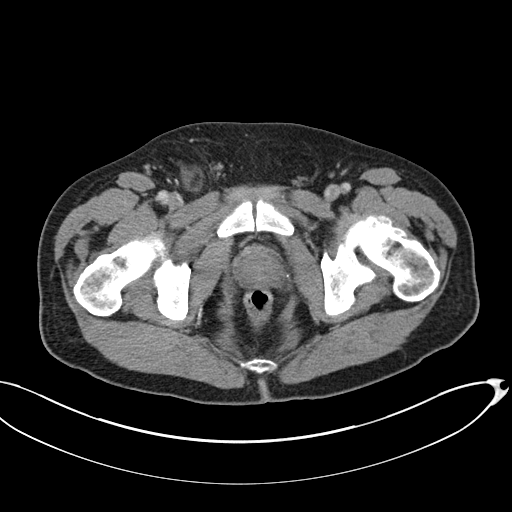
[im 40/111  soft-tissue]
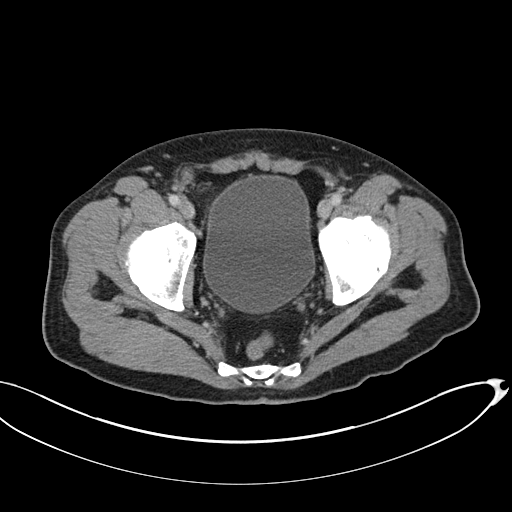
[im 48/111  soft-tissue]
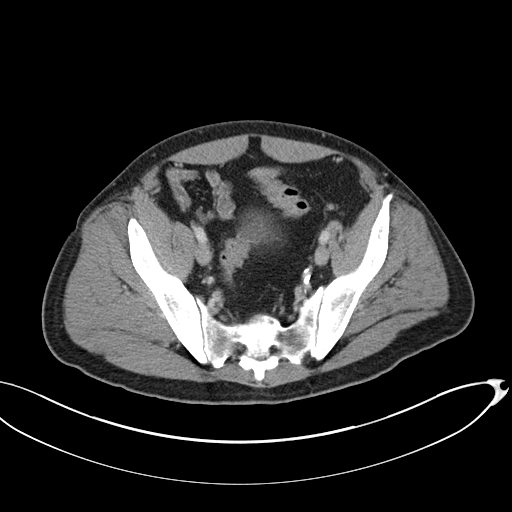
[im 56/111  soft-tissue]
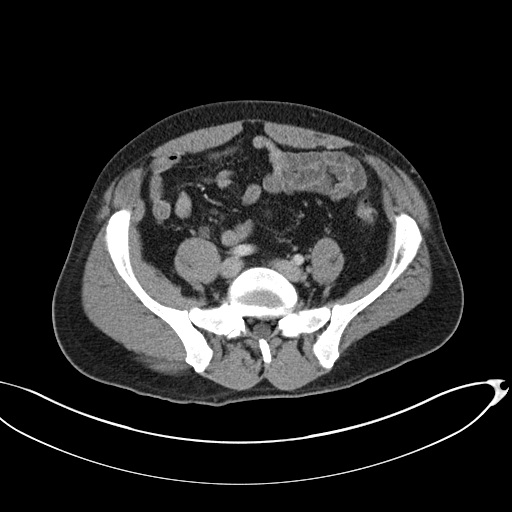
[im 63/111  soft-tissue]
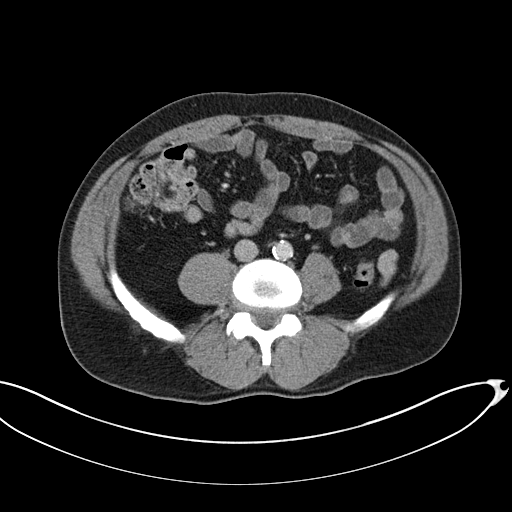
[im 71/111  soft-tissue]
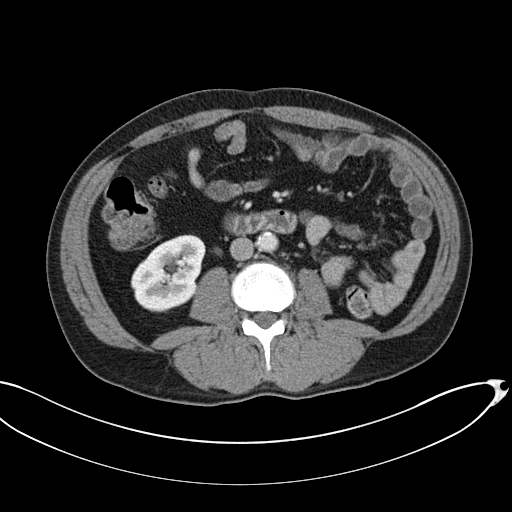
[im 71/111  bone]
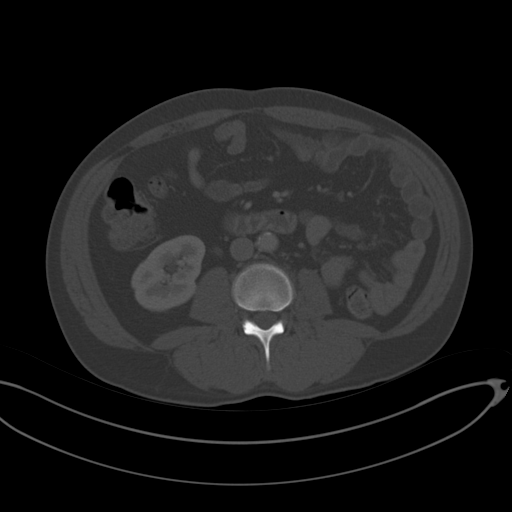
[im 79/111  soft-tissue]
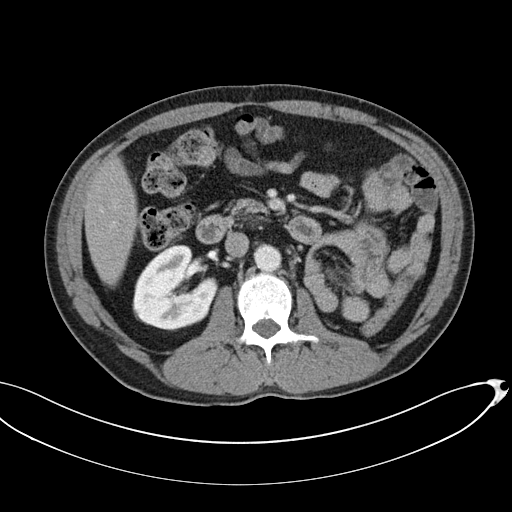
[im 87/111  soft-tissue]
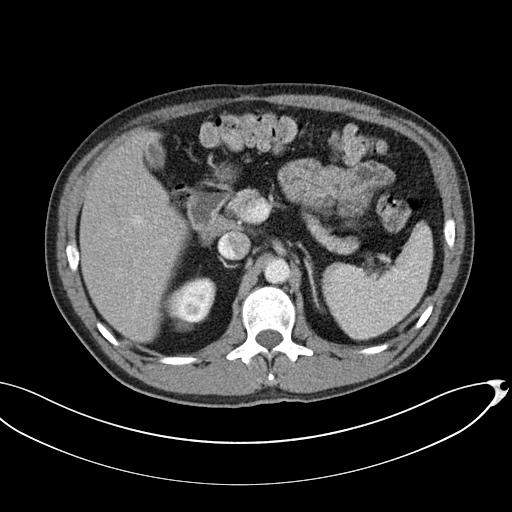
[im 95/111  soft-tissue]
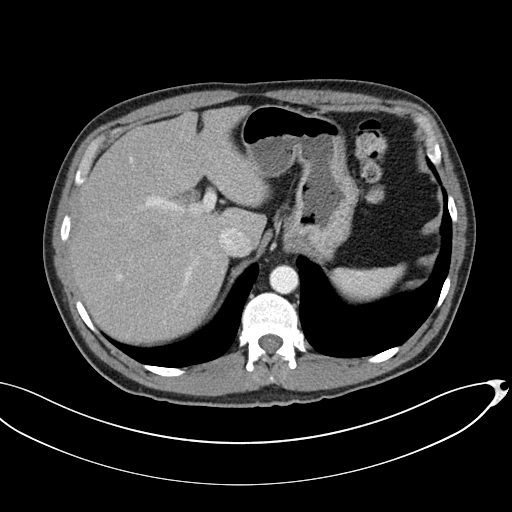
[im 103/111  soft-tissue]
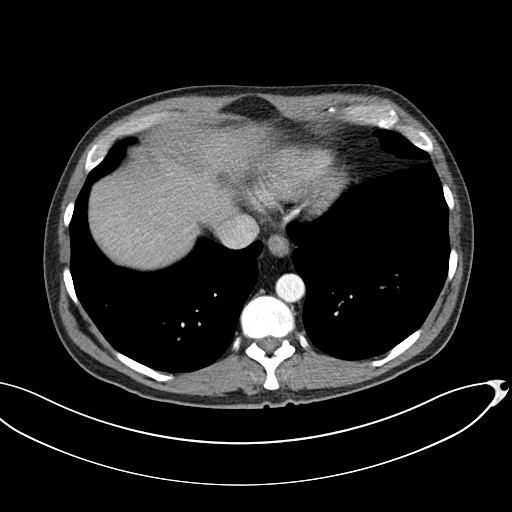

[Series 5: coronal st · coronal · 0.89mm/px · 3 of 101 slices shown]
[im 34/101  soft-tissue]
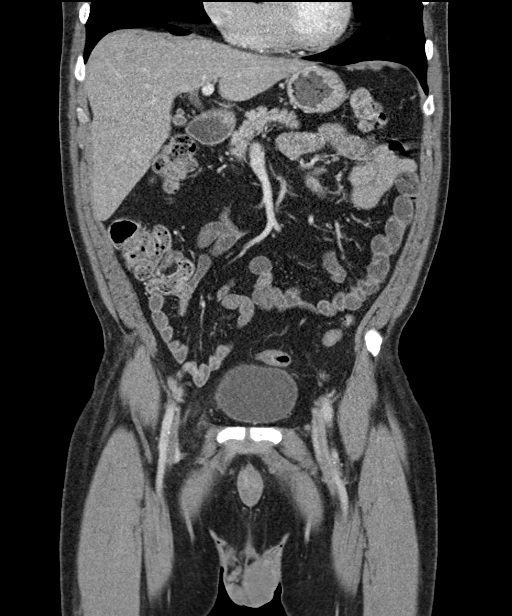
[im 45/101  soft-tissue]
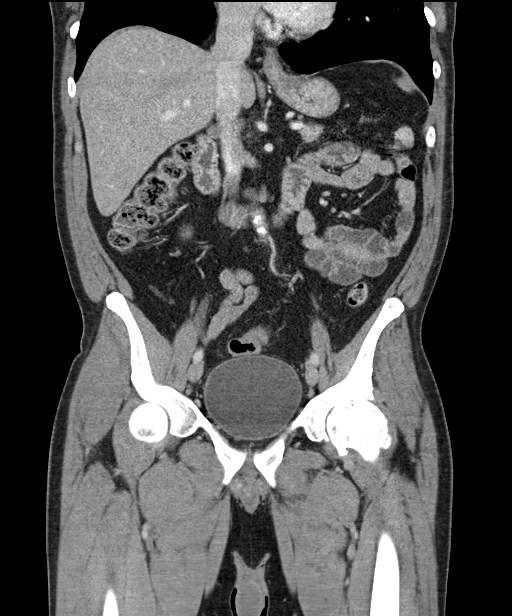
[im 56/101  soft-tissue]
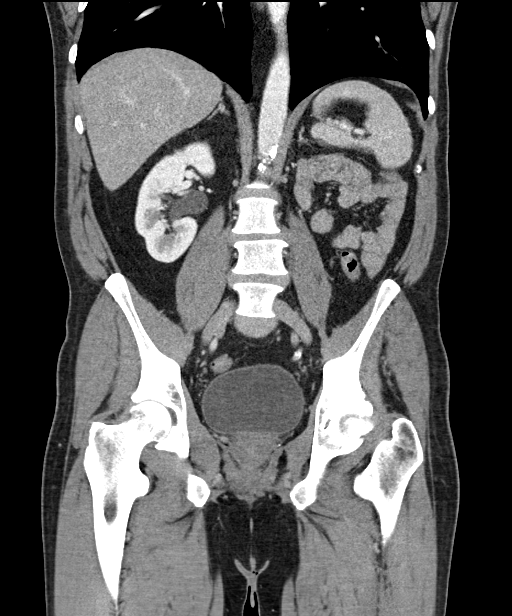

[16 of 46 positions shown; findings below may reference images not displayed]

FINDINGS: Lower chest: No acute abnormality.

Hepatobiliary: No focal liver abnormality is seen. No gallstones,
gallbladder wall thickening, or biliary dilatation.

Pancreas: Unremarkable. No pancreatic ductal dilatation or
surrounding inflammatory changes.

Spleen: Normal in size without focal abnormality.

Adrenals/Urinary Tract: Adrenal glands are unremarkable. Prior left
nephrectomy. Right kidney is normal, without renal calculi, focal
lesion, or hydronephrosis. Bladder is unremarkable.

Stomach/Bowel: Stomach is within normal limits. Appendix appears
normal. No evidence of bowel wall thickening, distention, or
inflammatory changes.

Vascular/Lymphatic: Aortic atherosclerosis. No enlarged abdominal or
pelvic lymph nodes.

Reproductive: Prostate is unremarkable.

Other: Moderate fat containing right inguinal hernia containing a
small amount of inflammatory fat stranding. Small fat containing
left inguinal hernia. No free fluid or pneumoperitoneum.

Musculoskeletal: No acute or significant osseous findings. Moderate
to severe left hip osteoarthritis.
IMPRESSION: 1. Moderate fat containing right inguinal hernia with mild
inflammatory changes of the fat.
2. Additional small fat containing left inguinal hernia.
3.  Aortic atherosclerosis ([id number]).

## 2021-01-21 ENCOUNTER — Encounter (HOSPITAL_COMMUNITY): Payer: Self-pay

## 2021-01-21 ENCOUNTER — Emergency Department (HOSPITAL_COMMUNITY)
Admission: EM | Admit: 2021-01-21 | Discharge: 2021-01-22 | Disposition: A | Discharge status: Home / Self Care | Payer: Medicaid Other | Attending: Emergency Medicine | Admitting: Emergency Medicine

## 2021-01-21 ENCOUNTER — Emergency Department (HOSPITAL_COMMUNITY): Payer: Medicaid Other

## 2021-01-21 DIAGNOSIS — R200 Anesthesia of skin: Secondary | ICD-10-CM

## 2021-01-21 DIAGNOSIS — Z5321 Procedure and treatment not carried out due to patient leaving prior to being seen by health care provider: Secondary | ICD-10-CM | POA: Diagnosis not present

## 2021-01-21 DIAGNOSIS — R202 Paresthesia of skin: Secondary | ICD-10-CM | POA: Insufficient documentation

## 2021-01-21 LAB — URINALYSIS, ROUTINE W REFLEX MICROSCOPIC
Bilirubin Urine: NEGATIVE
Glucose, UA: NEGATIVE mg/dL
Hgb urine dipstick: NEGATIVE
Ketones, ur: NEGATIVE mg/dL
Leukocytes,Ua: NEGATIVE
Nitrite: NEGATIVE
Protein, ur: NEGATIVE mg/dL
Specific Gravity, Urine: 1.011 (ref 1.005–1.030)
pH: 5 (ref 5.0–8.0)

## 2021-01-21 LAB — COMPREHENSIVE METABOLIC PANEL
ALT: 22 U/L (ref 0–44)
AST: 16 U/L (ref 15–41)
Albumin: 4.5 g/dL (ref 3.5–5.0)
Alkaline Phosphatase: 58 U/L (ref 38–126)
Anion gap: 4 — ABNORMAL LOW (ref 5–15)
BUN: 16 mg/dL (ref 6–20)
CO2: 23 mmol/L (ref 22–32)
Calcium: 9.1 mg/dL (ref 8.9–10.3)
Chloride: 108 mmol/L (ref 98–111)
Creatinine, Ser: 1.12 mg/dL (ref 0.61–1.24)
GFR, Estimated: >60 mL/min (ref >60)
Glucose, Bld: 83 mg/dL (ref 70–99)
Potassium: 4.2 mmol/L (ref 3.5–5.1)
Sodium: 135 mmol/L (ref 135–145)
Total Bilirubin: 0.9 mg/dL (ref 0.3–1.2)
Total Protein: 7.6 g/dL (ref 6.5–8.1)

## 2021-01-21 LAB — CBC WITH DIFFERENTIAL/PLATELET
Abs Immature Granulocytes: 0.02 10*3/uL (ref 0.00–0.07)
Basophils Absolute: 0.1 10*3/uL (ref 0.0–0.1)
Basophils Relative: 1 %
Eosinophils Absolute: 0.1 10*3/uL (ref 0.0–0.5)
Eosinophils Relative: 1 %
HCT: 40.2 % (ref 39.0–52.0)
Hemoglobin: 13.4 g/dL (ref 13.0–17.0)
Immature Granulocytes: 0 %
Lymphocytes Relative: 35 %
Lymphs Abs: 3.3 10*3/uL (ref 0.7–4.0)
MCH: 31.2 pg (ref 26.0–34.0)
MCHC: 33.3 g/dL (ref 30.0–36.0)
MCV: 93.5 fL (ref 80.0–100.0)
Monocytes Absolute: 0.7 10*3/uL (ref 0.1–1.0)
Monocytes Relative: 7 %
Neutro Abs: 5.2 10*3/uL (ref 1.7–7.7)
Neutrophils Relative %: 56 %
Platelets: 225 10*3/uL (ref 150–400)
RBC: 4.3 MIL/uL (ref 4.22–5.81)
RDW: 12.6 % (ref 11.5–15.5)
WBC: 9.4 10*3/uL (ref 4.0–10.5)
nRBC: 0 % (ref 0.0–0.2)

## 2021-01-21 MED ORDER — GADOBUTROL 1 MMOL/ML IV SOLN
10.0000 mL | Freq: Once | INTRAVENOUS | Status: AC | PRN
  Administered 2021-01-21: 10 mL via INTRAVENOUS

## 2021-01-21 NOTE — ED Triage Notes (Signed)
Pt states 2 nights ago he had a "hot flash then got cold and then the right side of my face went numb." Pt states he "cant make the right side of my face do anything." Pt has no hx of CVA. Pt has hx of MI. Pt states he called his PCP and was told to come to ED for eva/CT scan. Pt is A&Ox4. Danae Chen, PA evaluating pt.

## 2021-01-21 NOTE — ED Notes (Signed)
Dr Pickering at bedside 

## 2021-01-21 NOTE — ED Provider Notes (Signed)
Emergency Medicine Provider Triage Evaluation Note  Damon Smith , a 56 y.o. male  was evaluated in triage.  Pt complains of right-sided facial paralysis that started 2 days ago.  Patient states he walked in his house, felt a flash of extreme heat and then his face was the "perfect temperature" and after that he cannot make the right side of his face move.  He has no prior history of CVA.  Although he does have a history of MI.  He is recently finished a 10-day course of Augmentin for left-sided dental infection.  He denies slurred speech or unilateral weakness.  Walking normally.  He also endorses numbness on the left side of the scalp..  Review of Systems  Positive: Right-sided facial paralysis, with left-sided scalp numbness Negative: Fevers, chills, recent illness, weakness  Physical Exam  BP (!) 130/97 (BP Location: Left Arm)    Pulse 83    Temp 98.1 F (36.7 C) (Oral)    Resp 18    Ht 5\' 10"  (1.778 m)    Wt 90.7 kg    SpO2 98%    BMI 28.70 kg/m  Gen:   Awake, no distress   Resp:  Normal effort  MSK:   Moves extremities without difficulty; patient walking to and from room without difficulty Other:  Patient with right-sided facial paralysis, unable to raise right eyebrow, with right-sided facial droop: Cranial nerve XI intact, sensation of the upper extremities intact bilaterally, good grip strength bilaterally.  Medical Decision Making  Medically screening exam initiated at 1:03 PM.  Appropriate orders placed.  [name] was informed that the remainder of the evaluation will be completed by another provider, this initial triage assessment does not replace that evaluation, and the importance of remaining in the ED until their evaluation is complete.  Suspect patient's symptoms are due to a Bell's palsy, however notified Dr. Alvino Chapel.  He agrees that patient's symptoms are less likely due to stroke, but given an apparent history with cancer and scalp numbness, recommends  patient to receive MRI.   Tonye Pearson, [place] 01/21/21 1306    Davonna Belling, MD 01/21/21 1627

## 2022-05-05 ENCOUNTER — Telehealth (HOSPITAL_COMMUNITY): Payer: Self-pay | Admitting: Internal Medicine

## 2022-05-05 NOTE — Telephone Encounter (Signed)
Lvm with referral coordinator  [facility] stating pt does not have heart failure, need general cardiology.

## 2022-05-20 ENCOUNTER — Ambulatory Visit: Payer: Medicaid Other | Admitting: Podiatry

## 2022-06-24 NOTE — H&P (Signed)
  Patient: Damon Smith  PID: [id number]  DOB: [birth year]  SEX: Male     CC: Severe pain and swelling lower right.  HPI: Patient referred by DDS for extraction teeth in 2023. Med clearance received 01/20/2022 but pt declined surgery at that time.  Past Medical History:  High Blood Pressure, Chest Pain or Angina, Heart Attack, Hay Fever or Sinus Problems, Emphysema, Smoker, Kidney Failure, Swollen ankles, Arthritis, Cancer, Night Sweats, Chronic Pain, CAD    Medications: Lyrica, Metoprolol, Plavix, Hydrocodone, Diazepam, Clopidogrel, Albuterol    Allergies:     Aspirin    Surgeries:   Heart Surgery, Kidney Removal         Social History       Smoking:            Alcohol: Drug use:                             Exam: Multiple carious teeth # 3, 4, 6, 7, 8, 9, 10, 11, 13, 20, 22, 23, 24, 27, 29. Bilateral mandibular lingual tori.  No purulence, edema, fluctuance, trismus. Oral cancer screening negative. Pharynx clear. No lymphadenopathy.  Panorex:Multiple carious teeth # 3, 4, 6, 7, 8, 9, 10, 11, 13, 20, 22, 23, 24, 27, 29.   Assessment: ASA 3.  Non-restorable teeth#   3, 4, 6, 7, 8, 9, 10, 11, 13, 20, 22, 23, 24, 27, 29. Bilateral mandibular lingual tori.           Plan: Extraction Teeth #  3, 4, 6, 7, 8, 9, 10, 11, 13, 20, 22, 23, 24, 27, 29. removal Bilateral mandibular lingual tori. IV Sedation.                Rx: Augmentin 875 x 14, Motrin 800             Risks and complications explained. Questions answered.   Verlyn Dannenberg

## 2022-06-26 ENCOUNTER — Ambulatory Visit (HOSPITAL_COMMUNITY): Admission: RE | Admit: 2022-06-26 | Payer: Medicaid Other | Source: Home / Self Care | Admitting: Oral Surgery

## 2022-06-26 ENCOUNTER — Encounter (HOSPITAL_COMMUNITY): Admission: RE | Payer: Self-pay | Source: Home / Self Care

## 2022-06-26 SURGERY — DENTAL RESTORATION/EXTRACTIONS
Anesthesia: General

## 2022-06-30 NOTE — Progress Notes (Signed)
Cardiology Office Note:   Date:  07/03/2022  ID:  Damon Smith, DOB [birth year], MRN [id number]  History of Present Illness:   Damon Smith is a 57 y.o. male with chronic pain, COPD, CAD with prior PCI to DES, HTN, HLD, and tobacco abuse who was previously followed by Dr. Katrinka Smith but was lost to follow-up who was re-referred by Dr. Corky Smith for further evaluation of CAD.  Per review of the record, the patient has history of inferior STEMI in 2016. Cath at that time with 45% LM stenosis, 99% prox-to distal RCA. and 50% mid LAD to distal LAD , 1st diag  70% stenosis. He underwent PCI to RCA at that time. TTE with EF 55-60% but with hypokinesis of the basal-to-mid inferior wall. Repeat myoview 2019 with fixed basal inferoseptal, apical septal, apical inferior location. Repeat TTE 08/2017 with EF 55-60%, normal wall motion, G1DD, trivial MR, mild TR.  Had repeat TTE in 08/2021 with LVEF 50-55%m apical hypokinesis, no significant valve disease.  Today, the patient states that he is not feeling well. He has a lot of stress in his life and has been having episodes of chest pain with significant stress/anxiety. Requesting diazepam as this has helped in the past. No significant chest pain with exertion although mobility is limited due to pain. No significant SOB, orthopnea, LE edema or PND. Struggles significantly with insomnia, chronic pain and peripheral neuropathy.   Past Medical History:  Diagnosis Date   Chronic pain 10/16/2014   On Narcotics @ home.   COPD (chronic obstructive pulmonary disease) (HCC)    Coronary artery disease involving native heart with unstable angina pectoris (HCC) 10/17/2014   LM ~45%, mRCA 99% (DES PCI), RPL3 70%, mLAD 50% & ostial D1 70%, OM1 75%   Eczema    Essential hypertension 10/17/2014   Hyperlipidemia with target LDL less than 70 10/16/2014   Inguinal hernia    Low back pain    Neck pain, chronic    Osteoarthritis    Peripheral neuropathic pain     Presence of DES in RCA : Synergy 4.0 mm x 28 mm (4.5 mm) 10/17/2014   ST elevation myocardial infarction (STEMI) of inferior wall, initial episode of care (HCC) 10/16/2014   Tobacco abuse 10/16/2014   Urinary incontinence      ROS: As per HPI  Studies Reviewed:    EKG:  NSR, inferior q-waves, HR 69-personally reviewed  Cardiac Studies & Procedures   CARDIAC CATHETERIZATION  CARDIAC CATHETERIZATION 10/16/2014  Narrative 1. 1st RPLB lesion, 70% stenosed. 2. 1st Mrg lesion, 75% stenosed. 3. Mid LAD to Dist LAD lesion, 50% stenosed. 4. Ost 1st Diag lesion, 70% stenosed. 5. 1st Diag lesion, 35% stenosed. 6. LM lesion, 45% stenosed. 7. Prox RCA to Dist RCA lesion, 99% stenosed. There is a 0% residual stenosis post intervention. 8. A drug-eluting stent was placed.   Acute inferior wall ST elevation myocardial infarction due to high-grade mid right coronary obstruction with likely distal embolization to the microcirculation.  Successful PTCA and stenting of the mid right coronary from 99% to 0% with TIMI grade 3 flow. Residual 50-70% left ventricular branch obstruction is noted.  Moderate distal left main, moderate first diagonal stenosis, mild-to-moderate diffuse mid LAD, and moderately severe first obtuse marginal  Low normal left ventricular function with inferoapical hypokinesis. EF 45-50%.   RECOMMENDATIONS:   Aggressive risk factor modification including lipid management and smoking cessation.  Care management assistance with medication needs.  Phase I into cardiac rehabilitation  Discharge 48-72 hours.  Patient is high risk for medication noncompliance.  Findings Coronary Findings Diagnostic  Dominance: Right  Left Main  Left Anterior Descending diffuse eccentric .  First Diagonal Branch The lesion is type C eccentric .  Third Diagonal Branch The vessel is small in size.  Left Circumflex The vessel is small .  First Obtuse Marginal Branch diffuse  tubular eccentric .  Second Obtuse Marginal Branch The vessel is small in size.  Right Coronary Artery thrombotic eccentric ulcerative .  First Right Posterolateral Branch  Second Right Posterolateral Branch The vessel is small in size.  Third Right Posterolateral Branch The vessel is small in size.  Intervention  Prox RCA to Dist RCA lesion PCI The pre-interventional distal flow is decreased (TIMI 2). Pre-stent angioplasty was performed. A drug-eluting stent was placed. The strut is apposed. Post-stent angioplasty was performed. The post-interventional distal flow is normal (TIMI 3). The intervention was successful. No complications occurred at this lesion. Pressure wire/FFR was not performed on the lesion. IVUS was not performed on the lesion. FVR was not performed on the lesion. No optical coherence tomography (OCT) was performed. There is a 0% residual stenosis post intervention.   STRESS TESTS  MYOCARDIAL PERFUSION IMAGING 09/01/2017  Narrative  Nuclear stress EF: 59%. No wall motion abnormalities.  There was no ST segment deviation noted during stress.  Defect 1: There is a medium defect of moderate severity present in the basal inferoseptal, apical septal and apical inferior location.  Findings consistent with prior myocardial infarction with peri-infarct ischemia.  This is an intermediate risk study.  Damon Schultz, MD   ECHOCARDIOGRAM  ECHOCARDIOGRAM COMPLETE 09/01/2017  Narrative *[facility]* [address] [place], [place] [address] [phone]  ------------------------------------------------------------------- Transthoracic Echocardiography  Patient:    Damon Smith MR #:       [id number] Study Date: 09/01/2017 Gender:     M Age:        52 Height:     177.8 cm Weight:     88.5 kg BSA:        2.11 m^2 Pt. Status: Room:  Damon Smith, Damon Smith REFERRING    Damon Smith SONOGRAPHER  Damon Smith, RCS PERFORMING    [facility], Outpatient ATTENDING    Damon Smith, Damon Smith  cc:  ------------------------------------------------------------------- LV EF: 55% -   60%  ------------------------------------------------------------------- Indications:      Pre-Operative (Z01.810).  ------------------------------------------------------------------- History:   PMH:   Dyspnea.  ------------------------------------------------------------------- Study Conclusions  - Left ventricle: The cavity size was normal. There was mild focal basal hypertrophy of the septum. Systolic function was normal. The estimated ejection fraction was in the range of 55% to 60%. Wall motion was normal; there were no regional wall motion abnormalities. Doppler parameters are consistent with abnormal left ventricular relaxation (grade 1 diastolic dysfunction). - Aortic valve: Trileaflet; mildly thickened, mildly calcified leaflets. There was trivial regurgitation. - Mitral valve: There was trivial regurgitation. - Tricuspid valve: There was mild regurgitation.  ------------------------------------------------------------------- Study data:  Comparison was made to the study of 10/17/2014.  Study status:  Routine.  Procedure:  The patient reported no pain pre or post test. Transthoracic echocardiography. Image quality was adequate.          Transthoracic echocardiography.  M-mode, complete 2D, spectral Doppler, and color Doppler.  Birthdate: Patient birthdate: 21-Jan-1965.  Age:  Patient is 57 yr old.  Sex: Gender: male.    BMI: 28 kg/m^2.  Blood pressure:  106/63 Patient status:  Outpatient.  Study date:  Study date: 09/01/2017. Study time: 10:24 AM.  Location:  [facility]  -------------------------------------------------------------------  ------------------------------------------------------------------- Left ventricle:  The cavity size was normal. There was mild focal basal hypertrophy of the septum. Systolic  function was normal. The estimated ejection fraction was in the range of 55% to 60%. Wall motion was normal; there were no regional wall motion abnormalities. Doppler parameters are consistent with abnormal left ventricular relaxation (grade 1 diastolic dysfunction).  ------------------------------------------------------------------- Aortic valve:   Trileaflet; mildly thickened, mildly calcified leaflets. Mobility was not restricted.  Doppler:  Transvalvular velocity was within the normal range. There was no stenosis. There was trivial regurgitation.  ------------------------------------------------------------------- Aorta:  Aortic root: The aortic root was normal in size.  ------------------------------------------------------------------- Mitral valve:   Structurally normal valve.   Mobility was not restricted.  Doppler:  Transvalvular velocity was within the normal range. There was no evidence for stenosis. There was trivial regurgitation.    Peak gradient (D): 2 mm Hg.  ------------------------------------------------------------------- Left atrium:  The atrium was normal in size.  ------------------------------------------------------------------- Right ventricle:  The cavity size was normal. Wall thickness was normal. Systolic function was normal.  ------------------------------------------------------------------- Pulmonic valve:    Doppler:  Transvalvular velocity was within the normal range. There was no evidence for stenosis.  ------------------------------------------------------------------- Tricuspid valve:   Structurally normal valve.    Doppler: Transvalvular velocity was within the normal range. There was mild regurgitation.  ------------------------------------------------------------------- Pulmonary artery:   The main pulmonary artery was normal-sized. Systolic pressure was within the normal  range.  ------------------------------------------------------------------- Right atrium:  The atrium was normal in size.  ------------------------------------------------------------------- Pericardium:  There was no pericardial effusion.  ------------------------------------------------------------------- Systemic veins: Inferior vena cava: The vessel was normal in size. The respirophasic diameter changes were in the normal range (= 50%), consistent with normal central venous pressure. Diameter: 18 mm.  ------------------------------------------------------------------- Measurements  IVC                                        Value        Reference ID                                         18    mm     ----------  Left ventricle                             Value        Reference LV ID, ED, PLAX chordal            (L)     38.2  mm     43 - 52 LV ID, ES, PLAX chordal                    25.9  mm     23 - 38 LV fx shortening, PLAX chordal             32    %      >=29 LV PW thickness, ED                        11.4  mm     ---------- IVS/LV PW ratio, ED  1.14         <=1.3 Stroke volume, 2D                          66    ml     ---------- Stroke volume/bsa, 2D                      31    ml/m^2 ---------- LV e&', lateral                             12.1  cm/s   ---------- LV E/e&', lateral                           5.98         ---------- LV e&', medial                              7.72  cm/s   ---------- LV E/e&', medial                            9.37         ---------- LV e&', average                             9.91  cm/s   ---------- LV E/e&', average                           7.3          ----------  Ventricular septum                         Value        Reference IVS thickness, ED                          13    mm     ----------  LVOT                                       Value        Reference LVOT ID, S                                 21    mm      ---------- LVOT area                                  3.46  cm^2   ---------- LVOT peak velocity, S                      105   cm/s   ---------- LVOT mean velocity, S                      64.9  cm/s   ---------- LVOT VTI, S  19.1  cm     ----------  Aorta                                      Value        Reference Aortic root ID, ED                         36    mm     ----------  Left atrium                                Value        Reference LA ID, A-P, ES                             34    mm     ---------- LA ID/bsa, A-P                             1.61  cm/m^2 <=2.2 LA volume, S                               44.5  ml     ---------- LA volume/bsa, S                           21.1  ml/m^2 ---------- LA volume, ES, 1-p A4C                     32.8  ml     ---------- LA volume/bsa, ES, 1-p A4C                 15.6  ml/m^2 ---------- LA volume, ES, 1-p A2C                     55.2  ml     ---------- LA volume/bsa, ES, 1-p A2C                 26.2  ml/m^2 ----------  Mitral valve                               Value        Reference Mitral E-wave peak velocity                72.3  cm/s   ---------- Mitral A-wave peak velocity                75.7  cm/s   ---------- Mitral deceleration time           (L)     123   ms     150 - 230 Mitral peak gradient, D                    2     mm Hg  ---------- Mitral E/A ratio, peak                     1            ----------  Pulmonary arteries  Value        Reference PA pressure, S, DP                         25    mm Hg  <=30  Tricuspid valve                            Value        Reference Tricuspid regurg peak velocity             235   cm/s   ---------- Tricuspid peak RV-RA gradient              22    mm Hg  ---------- Tricuspid maximal regurg velocity,         235   cm/s   ---------- PISA  Right atrium                               Value        Reference RA ID, S-I, ES, A4C                 (H)     58.7  mm     34 - 49 RA area, ES, A4C                           17.6  cm^2   8.3 - 19.5 RA volume, ES, A/L                         44.3  ml     ---------- RA volume/bsa, ES, A/L                     21    ml/m^2 ----------  Systemic veins                             Value        Reference Estimated CVP                              3     mm Hg  ----------  Right ventricle                            Value        Reference TAPSE                                      19.8  mm     ---------- RV pressure, S, DP                         25    mm Hg  <=30 RV s&', lateral, S                          11.7  cm/s   ----------  Legend: (L)  and  (H)  mark values outside specified reference range.  ------------------------------------------------------------------- Prepared and Electronically Authenticated by  Damon Smith, M.D. 2019-08-14T12:51:12  TTE 08/2021: SUMMARY  The left ventricular size is normal.  Mild left ventricular hypertrophy  Left ventricular systolic function is low normal.  LV ejection fraction = 50-55%.  There are regional wall motion abnormalities as specified below.  There is apical LV wall hypokinesis  The right ventricle is normal in size and function.  There is no significant valvular stenosis or regurgitation.  The aortic sinus is normal size.  The IVC is normal in size with an inspiratory collapse of greater then  50%, suggesting normal right atrial pressure.  There is no pericardial effusion.  There is no comparison study available.   -  Risk Assessment/Calculations:              Physical Exam:   VS:  BP 120/82   Pulse 69   Ht 5\' 10"  (1.778 m)   Wt 199 lb 6.4 oz (90.4 kg)   SpO2 96%   BMI 28.61 kg/m    Wt Readings from Last 3 Encounters:  07/02/22 199 lb 6.4 oz (90.4 kg)  01/21/21 200 lb (90.7 kg)  02/14/18 197 lb (89.4 kg)     GEN: Well nourished, well developed in no acute distress NECK: No JVD; No carotid bruits CARDIAC:  RRR, no murmurs, rubs, gallops RESPIRATORY:  Diminished but clear ABDOMEN: Soft, non-tender, non-distended EXTREMITIES:  No edema; No deformity   ASSESSMENT AND PLAN:   #CAD with history of Inferior STEMI: -Patient with inferior STEMI in 2016 s/p PCI to RCA -Myoview 2019 with inferoseptal, apical septal and apical inferior scar but no ischemia -TTE 2019 with normal EF; EF at [facility] in 08/2021 50-55% with apical hypokinesis -Currently, having episodes of chest pain in the setting of stress/anxiety but no significant exertional symptoms -Given risk factors and known CAD, will check NM PET as patient is very concerned his heart artery disease has progressed -Continue plavix 75mg  dialy -Continue lipitor 80mg  daily -Change to metop 25mg  XL daily  #HLD: -Continue lipitor 80mg  daily -LDL 37  #HTN: -Change to metop 25mg  XL daily  #Tobacco Abuse: -Encourage cessation        Signed, Meriam Sprague, MD

## 2022-07-02 ENCOUNTER — Ambulatory Visit: Payer: Medicaid Other | Attending: Cardiology | Admitting: Cardiology

## 2022-07-02 ENCOUNTER — Encounter: Payer: Self-pay | Admitting: Cardiology

## 2022-07-02 VITALS — BP 120/82 | HR 69 | Ht 70.0 in | Wt 199.4 lb

## 2022-07-02 DIAGNOSIS — I10 Essential (primary) hypertension: Secondary | ICD-10-CM | POA: Diagnosis not present

## 2022-07-02 DIAGNOSIS — E7800 Pure hypercholesterolemia, unspecified: Secondary | ICD-10-CM

## 2022-07-02 DIAGNOSIS — Z720 Tobacco use: Secondary | ICD-10-CM

## 2022-07-02 DIAGNOSIS — R079 Chest pain, unspecified: Secondary | ICD-10-CM | POA: Diagnosis not present

## 2022-07-02 DIAGNOSIS — I25119 Atherosclerotic heart disease of native coronary artery with unspecified angina pectoris: Secondary | ICD-10-CM

## 2022-07-02 MED ORDER — METOPROLOL SUCCINATE ER 25 MG PO TB24: 25.0000 mg | ORAL_TABLET | Freq: Every day | ORAL | 3 refills | Status: AC

## 2022-07-02 NOTE — Patient Instructions (Addendum)
Medication Instructions:  Stop Metoprolol tartrate Start Metoprolol Succinate 25mg  daily.  *If you need a refill on your cardiac medications before your next appointment, please call your pharmacy*   Testing/Procedures: How to Prepare for Your Cardiac PET/CT Stress Test:  1. Please do not take these medications before your test:   Medications that may interfere with the cardiac pharmacological stress agent (ex. nitrates - including erectile dysfunction medications, isosorbide mononitrate, tamulosin or beta-blockers) the day of the exam. (Erectile dysfunction medication should be held for at least 72 hrs prior to test) PLEASE DO NOT TAKE METOPROLOL SUCCINATE OR NITROGLYCERIN THE DAY OF THIS EXAM.  Your remaining medications may be taken with water.  2. Nothing to eat or drink, except water, 3 hours prior to arrival time.   NO caffeine/decaffeinated products, or chocolate 12 hours prior to arrival.  3. NO perfume, cologne or lotion  4. Total time is 1 to 2 hours; you may want to bring reading material for the waiting time.  5. Please report to Radiology at the [facility] Main Entrance 30 minutes early for your test.  [address] [place], [place] [address]      In preparation for your appointment, medication and supplies will be purchased.  Appointment availability is limited, so if you need to cancel or reschedule, please call the Radiology Department at [phone]  24 hours in advance to avoid a cancellation fee of $100.00  What to Expect After you Arrive:  Once you arrive and check in for your appointment, you will be taken to a preparation room within the Radiology Department.  A technologist or Nurse will obtain your medical history, verify that you are correctly prepped for the exam, and explain the procedure.  Afterwards,  an IV will be started in your arm and electrodes will be placed on your skin for EKG monitoring during the stress portion of the exam. Then  you will be escorted to the PET/CT scanner.  There, staff will get you positioned on the scanner and obtain a blood pressure and EKG.  During the exam, you will continue to be connected to the EKG and blood pressure machines.  A small, safe amount of a radioactive tracer will be injected in your IV to obtain a series of pictures of your heart along with an injection of a stress agent.    After your Exam:  It is recommended that you eat a meal and drink a caffeinated beverage to counter act any effects of the stress agent.  Drink plenty of fluids for the remainder of the day and urinate frequently for the first couple of hours after the exam.  Your doctor will inform you of your test results within 7-10 business days.  For questions about your test or how to prepare for your test, please call: Rockwell Alexandria, Cardiac Imaging Nurse Navigator  Larey Brick, Cardiac Imaging Nurse Navigator Office: [phone]    Follow-Up: At [facility], you and your health needs are our priority.  As part of our continuing mission to provide you with exceptional heart care, we have created designated Provider Care Teams.  These Care Teams include your primary Cardiologist (physician) and Advanced Practice Providers (APPs -  Physician Assistants and Nurse Practitioners) who all work together to provide you with the care you need, when you need it.  We recommend signing up for the patient portal called "MyChart".  Sign up information is provided on this After Visit Summary.  MyChart is used  to connect with patients for Virtual Visits (Telemedicine).  Patients are able to view lab/test results, encounter notes, upcoming appointments, etc.  Non-urgent messages can be sent to your provider as well.   To learn more about what you can do with MyChart, go to [email or web address].    Your next appointment:   Monday Oct. 14th at 2:30pm  Provider:   Dr. Clifton James

## 2022-07-08 ENCOUNTER — Encounter (HOSPITAL_COMMUNITY): Payer: Self-pay | Admitting: Oral Surgery

## 2022-07-08 ENCOUNTER — Encounter (HOSPITAL_COMMUNITY): Payer: Self-pay | Admitting: Vascular Surgery

## 2022-07-08 ENCOUNTER — Other Ambulatory Visit: Payer: Self-pay

## 2022-07-08 NOTE — H&P (Signed)
   Reconsult    Patient: Damon Smith  PID: [id number]  DOB: March 10, 1965  SEX: Male   Patient referred by Riki Rusk , DDS for extraction teeth in 2023. Med clearance received 01/20/2022 but pt declined surgery at that time.  CC: Severe pain and swelling lower right.  Past Medical History:  High Blood Pressure, Chest Pain or Angina, Heart Attack, Hay Fever or Sinus Problems, Emphysema, Smoker, Kidney Failure, Swollen ankles, Arthritis, Cancer, Night Sweats, Chronic Pain, CAD    Medications: Lyrica, Metoprolol, Plavix, Hydrocodone, Diazepam, Clopidogrel, Albuterol    Allergies:     Aspirin    Surgeries:   Heart Surgery, Kidney Removal         Social History       Smoking:            Alcohol: Drug use:                             Exam: Multiple carious teeth # 3, 4, 6, 7, 8, 9, 10, 11, 13, 20, 22, 23, 24, 27, 29. Bilateral mandibular lingual tori.  No purulence, edema, fluctuance, trismus. Oral cancer screening negative. Pharynx clear. No lymphadenopathy.  Panorex:Multiple carious teeth # 3, 4, 6, 7, 8, 9, 10, 11, 13, 20, 22, 23, 24, 27, 29.   Assessment: ASA 3.  Non-restorable teeth#   3, 4, 6, 7, 8, 9, 10, 11, 13, 20, 22, 23, 24, 27, 29. Bilateral mandibular lingual tori.           Plan: Extraction Teeth #  3, 4, 6, 7, 8, 9, 10, 11, 13, 20, 22, 23, 24, 27, 29. removal Bilateral mandibular lingual tori. IV Sedation.                Rx: Augmentin 875 x 14, Motrin 800             Risks and complications explained. Questions answered.   Georgia Lopes, DMD

## 2022-07-08 NOTE — Progress Notes (Signed)
SDW call  Patient was given pre-op instructions over the phone. Patient verbalized understanding of instructions provided. Patient reports he saw Cardiology for stress and chest pain 07/03/2022.  Note from cardiology in Epic    PCP -  Dr. Scotty Court at [facility] Cardiologist - Dr. Laurance Flatten, formerly Dr. Bettina Gavia Pulmonary: denies   PPM/ICD - denies  Chest x-ray - n/a EKG -  07/02/2022 Stress Test - 09/01/2017 ECHO - 09/01/2017 Cardiac Cath - 09/2014  Sleep Study/sleep apnea/CPAP: denies  Non-diabetic  Blood Thinner Instructions: Plavix, states last dose was 07/03/2022 Aspirin Instructions:denies   ERAS Protcol - No, NPO PRE-SURGERY Ensure or G2-    COVID TEST- n/a    Anesthesia review: Yes. COPD, CAD, HTN, MI, angina, heart cath x 2   Patient denies shortness of breath, fever, cough and chest pain over the phone call  Your procedure is scheduled on Friday July 10, 2022  Report to [facility] Main Entrance "A" at 0530 A.M., then check in with the Admitting office.  Call this number if you have problems the morning of surgery:  [phone]   If you have any questions prior to your surgery date call [phone]: Open Monday-Friday 8am-4pm If you experience any cold or flu symptoms such as cough, fever, chills, shortness of breath, etc. between now and your scheduled surgery, please notify us at the above number    Remember:  Do not eat or drink after midnight the night before your surgery   Take these medicines the morning of surgery with A SIP OF WATER: States he takes all of his medications at night  As needed: Albuterol, percocet, refresh eye drops.   As of today, STOP taking any Aspirin (unless otherwise instructed by your surgeon) Aleve, Naproxen, Ibuprofen, Motrin, Advil, Goody's, BC's, all herbal medications, fish oil, and all vitamins.

## 2022-07-08 NOTE — Progress Notes (Addendum)
Anesthesia Chart Review: Damon Smith  Case: [id number] Date/Time: 07/10/22 0700   Procedure: DENTAL RESTORATION/EXTRACTIONS   Anesthesia type: General   Pre-op diagnosis: non restorable   [facility]: [facility] ROOM 04 / [facility]   Surgeons: Ocie Doyne, DMD       DISCUSSION: 57 year old male scheduled for the above procedure.  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  Damon Smith last cardiology evaluation was on 07/02/22 with Laurance Flatten, MD. He had previously seen [facility] cardiologist Seward Speck on 08/12/21 due to change in insurance, but had historically been followed at [facility] by recently retied Verdis Prime, MD from 2016-2019. 09/03/21 echo ([facility]) showed mild LVH, LVEF 50 to 55%, apical LV wall hypokinesis. Wall motion abnormality was new and LVEF previously 55-60% when compared to 08/2017 echo.  At his 07/02/22 visit, Dr. Shari Prows noted, "the patient states that he is not feeling well. He has a lot of stress in his life and has been having episodes of chest pain with significant stress/anxiety. Requesting diazepam as this has helped in the past. No significant chest pain with exertion although mobility is limited due to pain. No significant SOB, orthopnea, LE edema or PND. Struggles significantly with insomnia, chronic pain and peripheral neuropathy." She ordered an NM PET Cardiac Perfusion Study. She changed to metoprolol succinate 25 mg daily, and advised smoking cessation and to continue Plavix and Lipitor.  It looks like his previous cardiologist Dr. Alvino Chapel had signed a clearance letter for dental surgery back in January 2024, but with more recent visit with Dr. Shari Prows and pending cardiac testing, he will need updated cardiology input.  I notified Ginny at Dr. Randa Evens office. He reported he started holding Plavix on 07/03/22.   ADDENDUM 07/10/22 3:08 PM: Patient called to inquire about why his surgery was cancelled. To his recollection, he had told Dr. Shari Prows about dental surgery plans and got the impression that the NM Pet Cardiac Perfusion Study would not be needed prior to surgery. I explained that because he had a pending testing, I had asked Dr. Randa Evens staff to get updated cardiology input and clarification about timing of the test. As of yet, I have not heard any additional input from cardiology or from Dr. Randa Evens office, although I had noted the case had been cancelled. He was appreciative of the information. (He tells me Dr. Shari Prows is relocating to [facility] in the near future.) He is just frustrated because he has been dealing with left facial pain for months now. He has required 3 courses of antibiotics. He thinks he may need another round of antibiotics and has a call in to Dr. Randa Evens office, but has not heard back yet. He does not think he has had fever and does not notice any facial erythema to suggest facial cellulitis; However, I advised that if he thinks pain is worsening or is having fever or facial redness that he should seek urgent evaluation.     VS:  BP Readings from Last 3 Encounters:  07/02/22 120/82  01/21/21 (!) 130/97  02/14/18 (!) 132/93   Pulse Readings from Last 3 Encounters:  07/02/22 69  01/21/21 83  02/14/18 78     PROVIDERS: Harvest Forest, MD ([facility]) is listed as PCP, but now reported as Dr. Scotty Court with [facility]  - Hemal, Ashok,  MD is urologist ([facility]). Last APP visit 08/15/21 with one year follow-up planned.  - Last visit with cardiology was with Laurance Flatten, MD on 07/02/22 with follow-up scheduled for 11/02/22 with Verne Carrow, MD   LABS: For day of procedure as indicated. Creatinine 1.14, glucose 88, H/H 13.6/39.0 on 06/30/21  ([facility]).    IMAGES: CXR 08/15/21 ([facility]): FINDINGS: .  Supportive devices: No visualized internal supportive devices.  .  Cardiovascular/Mediastinum: Normal size and contours of the cardiomediastinal silhouette.  .  Lungs/Pleura: No focal consolidation. No pleural effusion. No pneumothorax.  .  Other: Visualized abdomen is unremarkable. No interval osseous abnormality.  IMPRESSION:  No radiographic evidence of acute cardiac or pulmonary abnormality.  US Renal 08/15/21 ([facility]): IMPRESSION: Status post left nephrectomy. No abnormal masses in the nephrectomy bed.  Normal sonographic appearance of the right kidney.    EKG: 07/02/22: NSR   CV: Echo 09/03/21 ([facility]): SUMMARY  The left ventricular size is normal.  Mild left ventricular hypertrophy  Left ventricular systolic function is low normal.  LV ejection fraction = 50-55%.  There are regional wall motion abnormalities as specified below.  There is apical LV wall hypokinesis  The right ventricle is normal in size and function.  There is no significant valvular stenosis or regurgitation.  The aortic sinus is normal size.  The IVC is normal in size with an inspiratory collapse of greater then  50%, suggesting normal right atrial pressure.  There is no pericardial effusion.  There is no comparison study available.  - Comparison 09/01/17: Mild focal basal hypertrophy of the septum, LVEF 55-60%, no regional wall motion abnormalities, grade 1 DD, trivial AR/MR/TR; 10/17/14: LVEF 55-60%, basal-midi inferior hypokinesis    Nuclear stress test 09/01/17:  Nuclear stress EF: 59%. No wall motion abnormalities.  There was no ST segment deviation noted during stress.  Defect 1: There is a medium defect of moderate severity present in the basal inferoseptal, apical septal and apical inferior location.  Findings consistent with prior myocardial infarction with peri-infarct ischemia.  This is an intermediate risk  study.   Cardiac cath 10/16/14: 1. 1st RPLB lesion, 70% stenosed. 2. 1st Mrg lesion, 75% stenosed. 3. Mid LAD to Dist LAD lesion, 50% stenosed. 4. Ost 1st Diag lesion, 70% stenosed. 5. 1st Diag lesion, 35% stenosed. 6. LM lesion, 45% stenosed. 7. Prox RCA to Dist RCA lesion, 99% stenosed. There is a 0% residual stenosis post intervention. 8. A drug-eluting stent was placed.   Acute inferior wall ST elevation myocardial infarction due to high-grade mid right coronary obstruction with likely distal embolization to the microcirculation.  Successful PTCA and stenting of the mid right coronary from 99% to 0% with TIMI grade 3 flow. Residual 50-70% left ventricular branch obstruction is noted.  Moderate distal left main, moderate first diagonal stenosis, mild-to-moderate diffuse mid LAD, and moderately severe first obtuse marginal  Low normal left ventricular function with inferoapical hypokinesis. EF 45-50%.    Past Medical History:  Diagnosis Date   Chronic pain 10/16/2014   On Narcotics @ home.   COPD (chronic obstructive pulmonary disease) (HCC)    Coronary artery disease involving native heart with unstable angina pectoris (HCC) 10/17/2014   LM ~45%, mRCA 99% (DES PCI), RPL3 70%, mLAD 50% & ostial D1 70%, OM1 75%   Eczema    Essential hypertension 10/17/2014   Hyperlipidemia with target LDL less than 70 10/16/2014   Inguinal hernia    Low back pain    Neck  pain, chronic    Osteoarthritis    Peripheral neuropathic pain    Presence of DES in RCA : Synergy 4.0 mm x 28 mm (4.5 mm) 10/17/2014   ST elevation myocardial infarction (STEMI) of inferior wall, initial episode of care (HCC) 10/16/2014   Tobacco abuse 10/16/2014   Urinary incontinence     Past Surgical History:  Procedure Laterality Date   CARDIAC CATHETERIZATION N/A 10/16/2014   Procedure: Left Heart Cath and Coronary Angiography;  Surgeon: Lyn Records, MD;  Location: [facility] INVASIVE CV LAB;  Service:  Cardiovascular;  LM ~45%, mRCA 99% (DES PCI), RPL3 70%, mLAD 50% & ostial D1 70%, OM1 75%   CARDIAC CATHETERIZATION N/A 10/16/2014   Procedure: Coronary Stent Intervention;  Surgeon: Lyn Records, MD;  Location: [facility] INVASIVE CV LAB;  Service: Cardiovascular;  mRCA 99% --> Synergy DES 4.0 mm x 28 mm (4.5 mm)   knee pain     NEPHRECTOMY Left 2018   TRANSTHORACIC ECHOCARDIOGRAM      MEDICATIONS: No current facility-administered medications for this encounter.    albuterol (VENTOLIN HFA) 108 (90 Base) MCG/ACT inhaler   atorvastatin (LIPITOR) 80 MG tablet   clopidogrel (PLAVIX) 75 MG tablet   fluticasone-salmeterol (ADVAIR) 250-50 MCG/ACT AEPB   metoprolol succinate (TOPROL XL) 25 MG 24 hr tablet   nitroGLYCERIN (NITROSTAT) 0.4 MG SL tablet   oxyCODONE-acetaminophen (PERCOCET) 10-325 MG tablet   Polyvinyl Alcohol-Povidone PF (REFRESH) 1.4-0.6 % SOLN   pregabalin (LYRICA) 200 MG capsule    Shonna Chock, PA-C Surgical Short Stay/Anesthesiology [facility] Phone [phone] [facility] Phone [phone] 07/08/2022 6:06 PM

## 2022-07-10 ENCOUNTER — Encounter (HOSPITAL_COMMUNITY): Payer: Self-pay | Admitting: Vascular Surgery

## 2022-07-10 ENCOUNTER — Ambulatory Visit (HOSPITAL_COMMUNITY): Admission: RE | Admit: 2022-07-10 | Payer: Medicaid Other | Source: Home / Self Care | Admitting: Oral Surgery

## 2022-07-10 ENCOUNTER — Telehealth: Payer: Self-pay | Admitting: Cardiology

## 2022-07-10 SURGERY — DENTAL RESTORATION/EXTRACTIONS
Anesthesia: General

## 2022-07-10 NOTE — Telephone Encounter (Signed)
Patient states he was supposed to be having oral surgery and undergoing 3 rounds of antibiotics. Patient states during 6/13 appointment, Dr. Shari Prows already cleared him to have the oral surgery. Patient states the performing office contacted our office and cancelled based on the information they received. Patient is requesting a call back to discuss further.

## 2022-07-10 NOTE — Telephone Encounter (Signed)
Spoke with pt who is upset that his oral surgery scheduled for today was cancelled.  He understood when he saw Dr Shari Prows, that she had cleared him for the oral surgery.  Pt reports he has to have this surgery ASAP because he "may have cellulitis" in his face and has already taken 3 rounds of antibiotics for this.  Advised pt I see no documentation giving surgical clearance and typically that would only be given after any needed testing r/t to his current s/s (chest pain) has been completed.  Pt has been ordered to have PET scan which has not been scheduled at this time.  He is upset and would like to have testing moved up.  Advised I will forward this information to Dr Shari Prows for review and  that she may be able to order a different type of testing that could be done sooner.  Pt was appreciative of the call and information.  He is aware he will be contacted after Dr Devin Going review.

## 2022-07-13 NOTE — Telephone Encounter (Signed)
Taim, Wurm - 07/10/2022  3:17 PM Meriam Sprague, MD  Sent: Caleen Essex July 10, 2022  8:00 PM  To: Sharin Grave, RN  Cc: Loa Socks, LPN         Message  Patient reported that he was already cleared by a Cardiology at [facility] and then he came in to see [place] and reported chest pain with stress. We were not asked to do Cardiology clearance but given his symptoms, we ordered stress testing. If they would like this prior to the procedure, we can change him to a myoview as this will be much quicker to obtain.  ----- Message -----    Left the pt a message to call the office back to discuss recommendations as indicated above by Dr. Shari Prows.

## 2022-07-13 NOTE — Telephone Encounter (Signed)
Attempted to call the pt back again and he did not answer.

## 2022-07-13 NOTE — Telephone Encounter (Signed)
Spoke with the pt on the phone.  Pt states he is unsure if his Surgeon's office/Anesthesiologist is requiring him to have a stress test done now before clearing him to have his teeth extracted.   Pt state he does not have an official date for his surgery yet.  It is TBD.   Informed the pt that we need an official cardiac clearance note sent from his Oral Surgeon's office to our office, so that we can safely advise on clearing him for his surgery.  Informed the pt this is office policy for all cardiac patients requiring clearance.  Advised the pt to have his surgeon's office fax the clearance to [phone] ATTN: Dr. Shari Prows.  He is aware that way, all parties are on the same page and then we can safely clear him from a cardiac perspective so that he can have his oral surgery done.   Informed the pt that we can go ahead and switch him to have a lexiscan done now vs Cardiac PET Scan in a few months, if he is needing clearance now for upcoming surgery.   Pt states he wants to hold off on switching to a lexiscan at this time, and will start with calling his surgeon's office tomorrow and have them fax the clearance to our office to further advise on, and go from there.   Pt states we can switch the stress testing based on clearance note the surgeon's office sends to [place] to advise on.   Informed the pt that would be fine and I will make Dr. Shari Prows aware of this plan.  Pt verbalized understanding and agrees with this plan.

## 2022-07-15 ENCOUNTER — Other Ambulatory Visit: Payer: Self-pay | Admitting: Internal Medicine

## 2022-07-15 DIAGNOSIS — J320 Chronic maxillary sinusitis: Secondary | ICD-10-CM

## 2022-07-25 ENCOUNTER — Other Ambulatory Visit: Payer: Self-pay

## 2022-07-25 ENCOUNTER — Emergency Department (HOSPITAL_COMMUNITY): Payer: Medicaid Other

## 2022-07-25 ENCOUNTER — Encounter (HOSPITAL_COMMUNITY): Payer: Self-pay

## 2022-07-25 ENCOUNTER — Emergency Department (HOSPITAL_COMMUNITY)
Admission: EM | Admit: 2022-07-25 | Discharge: 2022-07-25 | Disposition: A | Discharge status: Home / Self Care | Payer: Medicaid Other | Attending: Emergency Medicine | Admitting: Emergency Medicine

## 2022-07-25 DIAGNOSIS — D72829 Elevated white blood cell count, unspecified: Secondary | ICD-10-CM | POA: Diagnosis not present

## 2022-07-25 DIAGNOSIS — J449 Chronic obstructive pulmonary disease, unspecified: Secondary | ICD-10-CM | POA: Diagnosis not present

## 2022-07-25 DIAGNOSIS — R220 Localized swelling, mass and lump, head: Secondary | ICD-10-CM | POA: Diagnosis not present

## 2022-07-25 DIAGNOSIS — K029 Dental caries, unspecified: Secondary | ICD-10-CM | POA: Insufficient documentation

## 2022-07-25 DIAGNOSIS — Z7902 Long term (current) use of antithrombotics/antiplatelets: Secondary | ICD-10-CM | POA: Diagnosis not present

## 2022-07-25 DIAGNOSIS — L03115 Cellulitis of right lower limb: Secondary | ICD-10-CM | POA: Insufficient documentation

## 2022-07-25 DIAGNOSIS — R1900 Intra-abdominal and pelvic swelling, mass and lump, unspecified site: Secondary | ICD-10-CM | POA: Diagnosis not present

## 2022-07-25 DIAGNOSIS — I10 Essential (primary) hypertension: Secondary | ICD-10-CM | POA: Diagnosis not present

## 2022-07-25 DIAGNOSIS — R2243 Localized swelling, mass and lump, lower limb, bilateral: Secondary | ICD-10-CM | POA: Diagnosis present

## 2022-07-25 DIAGNOSIS — E871 Hypo-osmolality and hyponatremia: Secondary | ICD-10-CM | POA: Diagnosis not present

## 2022-07-25 DIAGNOSIS — L03116 Cellulitis of left lower limb: Secondary | ICD-10-CM | POA: Insufficient documentation

## 2022-07-25 DIAGNOSIS — Z7951 Long term (current) use of inhaled steroids: Secondary | ICD-10-CM | POA: Insufficient documentation

## 2022-07-25 DIAGNOSIS — L03119 Cellulitis of unspecified part of limb: Secondary | ICD-10-CM

## 2022-07-25 LAB — CBC WITH DIFFERENTIAL/PLATELET
Abs Immature Granulocytes: 0.04 10*3/uL (ref 0.00–0.07)
Basophils Absolute: 0 10*3/uL (ref 0.0–0.1)
Basophils Relative: 0 %
Eosinophils Absolute: 0 10*3/uL (ref 0.0–0.5)
Eosinophils Relative: 0 %
HCT: 39.9 % (ref 39.0–52.0)
Hemoglobin: 13.4 g/dL (ref 13.0–17.0)
Immature Granulocytes: 0 %
Lymphocytes Relative: 13 %
Lymphs Abs: 1.5 10*3/uL (ref 0.7–4.0)
MCH: 31.5 pg (ref 26.0–34.0)
MCHC: 33.6 g/dL (ref 30.0–36.0)
MCV: 93.7 fL (ref 80.0–100.0)
Monocytes Absolute: 0.6 10*3/uL (ref 0.1–1.0)
Monocytes Relative: 5 %
Neutro Abs: 9.5 10*3/uL — ABNORMAL HIGH (ref 1.7–7.7)
Neutrophils Relative %: 82 %
Platelets: 178 10*3/uL (ref 150–400)
RBC: 4.26 MIL/uL (ref 4.22–5.81)
RDW: 12 % (ref 11.5–15.5)
WBC: 11.8 10*3/uL — ABNORMAL HIGH (ref 4.0–10.5)
nRBC: 0 % (ref 0.0–0.2)

## 2022-07-25 LAB — COMPREHENSIVE METABOLIC PANEL
ALT: 20 U/L (ref 0–44)
AST: 16 U/L (ref 15–41)
Albumin: 4.3 g/dL (ref 3.5–5.0)
Alkaline Phosphatase: 69 U/L (ref 38–126)
Anion gap: 12 (ref 5–15)
BUN: 10 mg/dL (ref 6–20)
CO2: 23 mmol/L (ref 22–32)
Calcium: 8.9 mg/dL (ref 8.9–10.3)
Chloride: 99 mmol/L (ref 98–111)
Creatinine, Ser: 0.79 mg/dL (ref 0.61–1.24)
GFR, Estimated: >60 mL/min (ref >60)
Glucose, Bld: 112 mg/dL — ABNORMAL HIGH (ref 70–99)
Potassium: 3.6 mmol/L (ref 3.5–5.1)
Sodium: 134 mmol/L — ABNORMAL LOW (ref 135–145)
Total Bilirubin: 1.1 mg/dL (ref 0.3–1.2)
Total Protein: 7.6 g/dL (ref 6.5–8.1)

## 2022-07-25 LAB — URINALYSIS, ROUTINE W REFLEX MICROSCOPIC
Bilirubin Urine: NEGATIVE
Glucose, UA: NEGATIVE mg/dL
Hgb urine dipstick: NEGATIVE
Ketones, ur: NEGATIVE mg/dL
Leukocytes,Ua: NEGATIVE
Nitrite: NEGATIVE
Protein, ur: NEGATIVE mg/dL
Specific Gravity, Urine: 1.004 — ABNORMAL LOW (ref 1.005–1.030)
pH: 5 (ref 5.0–8.0)

## 2022-07-25 LAB — LACTIC ACID, PLASMA: Lactic Acid, Venous: 1 mmol/L (ref 0.5–1.9)

## 2022-07-25 LAB — PROTIME-INR
INR: 1 (ref 0.8–1.2)
Prothrombin Time: 12.9 seconds (ref 11.4–15.2)

## 2022-07-25 MED ORDER — SODIUM CHLORIDE (PF) 0.9 % IJ SOLN
INTRAMUSCULAR | Status: AC
  Filled 2022-07-25: qty 50

## 2022-07-25 MED ORDER — CEPHALEXIN 500 MG PO CAPS: 500.0000 mg | ORAL_CAPSULE | Freq: Four times a day (QID) | ORAL | 0 refills | Status: AC

## 2022-07-25 MED ORDER — IOHEXOL 300 MG/ML  SOLN
80.0000 mL | Freq: Once | INTRAMUSCULAR | Status: AC | PRN
  Administered 2022-07-25: 80 mL via INTRAVENOUS

## 2022-07-25 MED ORDER — OXYCODONE-ACETAMINOPHEN 5-325 MG PO TABS
2.0000 | ORAL_TABLET | Freq: Once | ORAL | Status: AC
  Administered 2022-07-25: 2 via ORAL
  Filled 2022-07-25: qty 2

## 2022-07-25 MED ORDER — SODIUM CHLORIDE 0.9 % IV BOLUS
1000.0000 mL | Freq: Once | INTRAVENOUS | Status: AC
  Administered 2022-07-25: 1000 mL via INTRAVENOUS

## 2022-07-25 NOTE — ED Provider Notes (Signed)
[place] EMERGENCY DEPARTMENT AT [facility] Provider Note   CSN: [id number] Arrival date & time: 07/25/22  4098     History  Chief Complaint  Patient presents with   Leg Pain    Damon Smith is a 57 y.o. male with past medical history significant for hyperlipidemia, STEMI, chronic pain, osteoarthritis, hypertension, tobacco abuse, COPD presents to the ED complaining of bilateral leg, abdominal, and nose swelling since 07/21/22 and believes this is associated with a change in his pain medicine.  Patient contacted his pain management provider and was told it is not likely related to his medicine.  Patient was scheduled to have dental surgery to remove broken and infected teeth on 07/10/22, but it was canceled due to patient not having surgical clearance from cardiology.  He states the symptoms have been worsening over the past few days are very painful.  Denies fever, chills, nausea, vomiting, diarrhea, weakness, gait problem, chest pain, shortness of breath.         Home Medications Prior to Admission medications   Medication Sig Start Date End Date Taking? Authorizing Provider  cephALEXin (KEFLEX) 500 MG capsule Take 1 capsule (500 mg total) by mouth 4 (four) times daily for 7 days. 07/25/22 08/01/22 Yes Avel Ogawa R, PA-C  albuterol (VENTOLIN HFA) 108 (90 Base) MCG/ACT inhaler Inhale 1 puff into the lungs every 4 (four) hours as needed for wheezing or shortness of breath.    [provider]  atorvastatin (LIPITOR) 80 MG tablet Take 80 mg by mouth at bedtime.    [provider]  clopidogrel (PLAVIX) 75 MG tablet TAKE ONE TABLET BY MOUTH DAILY Patient taking differently: Take 75 mg by mouth at bedtime. 01/04/17   Leone Brand, NP  fluticasone-salmeterol (ADVAIR) 250-50 MCG/ACT AEPB Inhale 1 puff into the lungs in the morning and at bedtime.    [provider]  metoprolol succinate (TOPROL XL) 25 MG 24 hr tablet Take 1 tablet (25 mg total) by  mouth daily. 07/02/22   Meriam Sprague, MD  nitroGLYCERIN (NITROSTAT) 0.4 MG SL tablet Place 1 tablet (0.4 mg total) under the tongue every 5 (five) minutes x 3 doses as needed for chest pain. 10/18/14   Azalee Course, PA  oxyCODONE-acetaminophen (PERCOCET) 10-325 MG tablet Take 1 tablet by mouth See admin instructions. Six times a day    [provider]  Polyvinyl Alcohol-Povidone PF (REFRESH) 1.4-0.6 % SOLN Place 1 drop into both eyes daily as needed (Dry eyes).    [provider]  pregabalin (LYRICA) 200 MG capsule Take 200 mg by mouth 3 (three) times daily.    [provider]      Allergies    Patient has no known allergies.    Review of Systems   Review of Systems  Constitutional:  Negative for chills and fever.  HENT:  Positive for dental problem and facial swelling (nose swelling).   Respiratory:  Negative for shortness of breath.   Cardiovascular:  Negative for chest pain.  Gastrointestinal:  Positive for abdominal distention (abdominal swelling) and abdominal pain. Negative for diarrhea, nausea and vomiting.  Musculoskeletal:  Negative for gait problem.  Skin:  Positive for rash.  Neurological:  Negative for weakness.    Physical Exam Updated Vital Signs BP (!) 151/97   Pulse 100   Temp 98.8 F (37.1 C) (Oral)   Resp 18   Ht 5\' 10"  (1.778 m)   Wt 90.7 kg   SpO2 99%  BMI 28.70 kg/m  Physical Exam Vitals and nursing note reviewed.  Constitutional:      General: He is not in acute distress.    Appearance: He is not ill-appearing.  HENT:     Nose: No congestion or rhinorrhea.     Comments: Nose does not appear significantly swollen.  Skin is dry and scaling.      Mouth/Throat:     Mouth: Mucous membranes are moist.     Dentition: Abnormal dentition. Dental caries present. No gingival swelling, dental abscesses or gum lesions.     Pharynx: Oropharynx is clear.     Comments: Poor dentition. Cardiovascular:     Rate and Rhythm: Regular  rhythm. Tachycardia present.     Pulses: Normal pulses.     Heart sounds: Normal heart sounds.  Pulmonary:     Effort: Pulmonary effort is normal. No respiratory distress.     Breath sounds: Normal breath sounds and air entry.  Abdominal:     General: Abdomen is flat. Bowel sounds are normal. There is no distension.     Palpations: Abdomen is soft.     Tenderness: There is no abdominal tenderness.  Musculoskeletal:     Right lower leg: Swelling and tenderness present. 2+ Edema present.     Left lower leg: Swelling and tenderness present. 2+ Edema present.     Comments: Bilateral lower extremities with increased warmth and swelling.  DP pulses are 2+.    Skin:    General: Skin is warm and dry.     Capillary Refill: Capillary refill takes less than 2 seconds.     Findings: Erythema present.     Comments: Bilateral lower extremities with erythema.    Neurological:     Mental Status: He is alert. Mental status is at baseline.  Psychiatric:        Mood and Affect: Mood normal.        Behavior: Behavior normal.     ED Results / Procedures / Treatments   Labs (all labs ordered are listed, but only abnormal results are displayed) Labs Reviewed  CBC WITH DIFFERENTIAL/PLATELET - Abnormal; Notable for the following components:      Result Value   WBC 11.8 (*)    Neutro Abs 9.5 (*)    All other components within normal limits  COMPREHENSIVE METABOLIC PANEL - Abnormal; Notable for the following components:   Sodium 134 (*)    Glucose, Bld 112 (*)    All other components within normal limits  URINALYSIS, ROUTINE W REFLEX MICROSCOPIC - Abnormal; Notable for the following components:   Color, Urine STRAW (*)    Specific Gravity, Urine 1.004 (*)    All other components within normal limits  LACTIC ACID, PLASMA  PROTIME-INR    EKG None  Radiology CT ABDOMEN PELVIS W CONTRAST  Result Date: 07/25/2022 CLINICAL DATA:  Bilateral leg and abdominal pain. EXAM: CT ABDOMEN AND PELVIS WITH  CONTRAST TECHNIQUE: Multidetector CT imaging of the abdomen and pelvis was performed using the standard protocol following bolus administration of intravenous contrast. RADIATION DOSE REDUCTION: This exam was performed according to the departmental dose-optimization program which includes automated exposure control, adjustment of the mA and/or kV according to patient size and/or use of iterative reconstruction technique. CONTRAST:  80mL OMNIPAQUE IOHEXOL 300 MG/ML  SOLN COMPARISON:  08/09/2018 FINDINGS: Lower chest: Clear lung bases. Hepatobiliary: No focal liver abnormality is seen. No gallstones, gallbladder wall thickening, or biliary dilatation. Pancreas: Unremarkable. No pancreatic ductal dilatation or surrounding  inflammatory changes. Spleen: Normal in size without focal abnormality. Adrenals/Urinary Tract: No adrenal mass. Absent left kidney. No left renal fossa mass or abnormality. Right kidney normal in size, orientation and position. No renal mass, stone or hydronephrosis. Ureters normal in course and in caliber. Bladder is unremarkable. Stomach/Bowel: Normal stomach. Small bowel and colon are normal in caliber. No wall thickening. No inflammation. Normal appendix visualized. Vascular/Lymphatic: Aortic atherosclerosis. No aneurysm. No enlarged lymph nodes. Reproductive: Unremarkable. Other: Small umbilical hernia containing a knuckle of small bowel, without incarceration, strangulation or obstruction, similar to the prior CT. Musculoskeletal: No fracture or acute finding. No bone lesion. Advanced left hip joint arthropathic changes. IMPRESSION: 1. No acute findings within the abdomen or pelvis. No findings to account for abdominal pain. 2. Aortic atherosclerosis. Electronically Signed   By: Amie Portland M.D.   On: 07/25/2022 09:14    Procedures Procedures     Medications Ordered in ED Medications  oxyCODONE-acetaminophen (PERCOCET/ROXICET) 5-325 MG per tablet 2 tablet (2 tablets Oral Given 07/25/22  0723)  iohexol (OMNIPAQUE) 300 MG/ML solution 80 mL (80 mLs Intravenous Contrast Given 07/25/22 0818)  sodium chloride 0.9 % bolus 1,000 mL (0 mLs Intravenous Stopped 07/25/22 1013)    ED Course/ Medical Decision Making/ A&P                             Medical Decision Making Amount and/or Complexity of Data Reviewed Labs: ordered. Radiology: ordered.  Risk Prescription drug management.   This patient presents to the ED with chief complaint(s) of swelling to the legs, abdomen, and nose with pain with pertinent past medical history of tobacco abuse, chronic pain, chronic opiate therapy, HTN, HLD, STEMI, COPD, renal cell carcinoma.  The complaint involves an extensive differential diagnosis and also carries with it a high risk of complications and morbidity.    The differential diagnosis includes    The initial plan is to obtain infection workup, CT abd/pelvis  Additional history obtained: Records reviewed  - PDMP; patient was prescribed 15 mg oxycodone hcl, 150 tablets for 30 days on 07/21/22 for chronic pain.  Patient was previously prescribed Percocet by pain management provider.    Initial Assessment:   Exam significant for bilateral lower extremity erythema with increased warmth, swelling and tenderness.  Skin is dry and flaky.  DP pulses are 2+ bilaterally.  Abdomen does not appear distended and is not tender to palpation.  Umbilical hernia present.  No overlying skin changes.  Nose appears dry, but no significant facial swelling appreciated.   Independent ECG/labs interpretation:  The following labs were independently interpreted:  CBC with mild leukocytosis, no anemia.  Metabolic panel with mild hyponatremia, no major electrolyte disturbance.  UA negative for infection.  Lactic and PT/INR both normal.  Hepatic and renal function normal.   Independent visualization and interpretation of imaging: I independently visualized the following imaging with scope of interpretation limited to  determining acute life threatening conditions related to emergency care: CT abdomen/pelvis, which revealed no evidence of acute intraabdominal pathology to explain patient's abdominal symptoms.   Treatment and Reassessment: Patient given IV fluids and PO pain medicine with improvement in his symptoms.  Patient's HR improved.    Disposition:   Patient's workup is overall reassuring.  He has evidence of cellulitis in bilateral lower extremities which we will treat with oral antibiotics.  Patient to follow up with primary care provider.  Recommended he reach out to pain management provider  regarding his prescriptions and medication change.    The patient has been appropriately medically screened and/or stabilized in the ED. I have low suspicion for any other emergent medical condition which would require further screening, evaluation or treatment in the ED or require inpatient management. At time of discharge the patient is hemodynamically stable and in no acute distress. I have discussed work-up results and diagnosis with patient and answered all questions. Patient is agreeable with discharge plan. We discussed strict return precautions for returning to the emergency department and they verbalized understanding.    Social Determinants of Health:   Patient's  tobacco abuse  increases the complexity of managing their presentation         Final Clinical Impression(s) / ED Diagnoses Final diagnoses:  Cellulitis of lower extremity, unspecified laterality  Swelling of abdomen  Swelling of nose    Rx / DC Orders ED Discharge Orders          Ordered    cephALEXin (KEFLEX) 500 MG capsule  4 times daily        07/25/22 1003              Lenard Simmer, PA-C 07/25/22 1034    Jacalyn Lefevre, MD 07/25/22 1529

## 2022-07-25 NOTE — ED Notes (Signed)
Patient transported to CT 

## 2022-07-25 NOTE — Discharge Instructions (Addendum)
Thank you for allowing me to be a part of your care today.  You were evaluated in the ED for swelling and pain.   Your workup is overall reassuring, but there is concern for infection in your legs.  I am sending you home on an antibiotic to take for the next 7 days.  Please take this medicine as prescribed for the entire course, even if your symptoms begin to improve.    Please schedule a follow up appointment with your primary care doctor and contact your pain management doctor to discuss your prescription medication.   Return to the ED if you develop sudden worsening of your symptoms or if you have any new concerns.

## 2022-07-25 NOTE — ED Triage Notes (Signed)
Pt reports bilateral leg, abdominal, and nose swelling since 07/21/22. Pt associates this with a change in his pain meds that was made recently.

## 2022-07-27 ENCOUNTER — Other Ambulatory Visit: Payer: Self-pay | Admitting: Internal Medicine

## 2022-07-27 DIAGNOSIS — G4452 New daily persistent headache (NDPH): Secondary | ICD-10-CM

## 2022-07-29 ENCOUNTER — Encounter: Payer: Self-pay | Admitting: Internal Medicine

## 2022-07-31 ENCOUNTER — Other Ambulatory Visit: Payer: Self-pay | Admitting: Internal Medicine

## 2022-07-31 DIAGNOSIS — R600 Localized edema: Secondary | ICD-10-CM

## 2022-08-17 ENCOUNTER — Ambulatory Visit
Admission: RE | Admit: 2022-08-17 | Discharge: 2022-08-17 | Disposition: A | Discharge status: Home / Self Care | Payer: Medicaid Other | Source: Ambulatory Visit | Attending: Internal Medicine | Admitting: Internal Medicine

## 2022-08-17 DIAGNOSIS — J320 Chronic maxillary sinusitis: Secondary | ICD-10-CM

## 2022-08-17 DIAGNOSIS — G4452 New daily persistent headache (NDPH): Secondary | ICD-10-CM

## 2022-08-27 ENCOUNTER — Other Ambulatory Visit (HOSPITAL_BASED_OUTPATIENT_CLINIC_OR_DEPARTMENT_OTHER): Payer: Self-pay | Admitting: Internal Medicine

## 2022-08-27 DIAGNOSIS — R600 Localized edema: Secondary | ICD-10-CM

## 2022-08-29 ENCOUNTER — Ambulatory Visit (HOSPITAL_BASED_OUTPATIENT_CLINIC_OR_DEPARTMENT_OTHER)
Admission: RE | Admit: 2022-08-29 | Discharge: 2022-08-29 | Disposition: A | Discharge status: Home / Self Care | Payer: Medicaid Other | Source: Ambulatory Visit | Attending: Internal Medicine | Admitting: Internal Medicine

## 2022-08-29 DIAGNOSIS — R600 Localized edema: Secondary | ICD-10-CM

## 2022-09-08 ENCOUNTER — Telehealth: Payer: Self-pay | Admitting: *Deleted

## 2022-09-08 NOTE — Telephone Encounter (Signed)
   Pre-operative Risk Assessment    Patient Name: Damon Smith  DOB: 1965/08/10 MRN: [id number]     Request for Surgical Clearance    Procedure:  Dental Extraction - Amount of Teeth to be Pulled:  11 TEETH  WITH ALVEOLOPLASTY, REMOVAL B/L MANDIBULAR LINGUAL TORI  Date of Surgery:  Clearance TBD                                 Surgeon:  DR. Ocie Doyne,. DMD Surgeon's Group or Practice Name:  [facility] D[facility]ne, DMD Phone number:  [phone] Fax number:  [phone]   Type of Clearance Requested:   - Medical ; PLAVIX    Type of Anesthesia:  General    Additional requests/questions:    Elpidio Anis   09/08/2022, 2:57 PM

## 2022-10-08 NOTE — Telephone Encounter (Signed)
Can we check on status of scheduling PET stress? Also can we call patient to see if he needs surgery urgently, his cardiac clearance request indicates TBD. Plan from Dr. Shari Prows was to switch to [facility] if need was urgent. He will also need to be seen in clinic for clearance given symptoms noted to Dr. Shari Prows at last visit. He has appointment with Dr. Clifton James scheduled for 10/14, if stress testing can be completed by then he can better address clearance.   Thank you,  Reather Littler, NP

## 2022-10-09 NOTE — Telephone Encounter (Signed)
I sent message to nurse navigators to see why pt has not been scheduled.

## 2022-10-12 NOTE — Telephone Encounter (Addendum)
Patient Name: Damon Smith  DOB: 02-27-1965 MRN: [id number]  Primary Cardiologist: Lesleigh Noe, MD (Inactive), saw Dr. Laurance Flatten, then scheduled to establish with Dr. Clifton James 11/02/22  Chart reviewed as part of pre-operative protocol coverage. Patient saw Dr. Shari Prows 07/02/22 complaining of chest pain at which time cardiac PET was ordered. This is still pending scheduling and the preop team last week sent a message to the navigators to help get this scheduled prior to his upcoming appointment. In the meantime he also has a follow-up appointment scheduled with Dr. Clifton James 11/02/22. Per pre-op protocol, any patient with concerning symptoms pending pre-op eval will need to have follow-up completed prior to clearance being granted to proceed. Plavix and review for any SBE ppx needs can be addressed by MD at that time as well.  I will add "pre-op" comment to appt notes so Dr. Clifton James aware to address at that time and route note to him as FYI.  Will also send this message to the surgeon so they are aware.  Will remove from preop APP box as separate APP input not required at this time given plan for upcoming testing and follow-up.  Laurann Montana, PA-C 10/12/2022, 8:27 AM

## 2022-11-02 ENCOUNTER — Ambulatory Visit: Payer: Medicaid Other | Attending: Cardiovascular Disease | Admitting: Cardiovascular Disease

## 2022-11-02 ENCOUNTER — Encounter: Payer: Self-pay | Admitting: Cardiovascular Disease

## 2022-11-02 VITALS — BP 118/70 | HR 74 | Ht 71.0 in | Wt 193.0 lb

## 2022-11-02 DIAGNOSIS — I10 Essential (primary) hypertension: Secondary | ICD-10-CM | POA: Diagnosis not present

## 2022-11-02 DIAGNOSIS — I2510 Atherosclerotic heart disease of native coronary artery without angina pectoris: Secondary | ICD-10-CM

## 2022-11-02 DIAGNOSIS — E7800 Pure hypercholesterolemia, unspecified: Secondary | ICD-10-CM | POA: Diagnosis not present

## 2022-11-02 NOTE — Patient Instructions (Signed)
Medication Instructions:  No changes *If you need a refill on your cardiac medications before your next appointment, please call your pharmacy*   Lab Work: none If you have labs (blood work) drawn today and your tests are completely normal, you will receive your results only by: MyChart Message (if you have MyChart) OR A paper copy in the mail If you have any lab test that is abnormal or we need to change your treatment, we will call you to review the results.   Testing/Procedures: none   Follow-Up: At [facility], you and your health needs are our priority.  As part of our continuing mission to provide you with exceptional heart care, we have created designated Provider Care Teams.  These Care Teams include your primary Cardiologist (physician) and Advanced Practice Providers (APPs -  Physician Assistants and Nurse Practitioners) who all work together to provide you with the care you need, when you need it.   Your next appointment:   12 month(s)  Provider:   Verne Carrow, MD

## 2022-11-02 NOTE — Progress Notes (Signed)
Chief Complaint  Patient presents with   Follow-up    CAD   History of Present Illness: 57 yo male with history of CAD, COPD, HTN, HLD and tobacco abuse who is here today for follow up. He had been followed in our office by Dr. Katrinka Blazing and then by Dr. Shari Prows. He has a history of inferior STEMI in 2016 and cath showed 45% LM stenosis, 99% prox-to distal RCA stenosis, 50% mid LAD to distal LAD stenosis and 70% Diagonal stenosis. A stent was placed in the RCA. Myoview 2019 with fixed basal inferoseptal, apical septal, apical inferior location. Echo in August 2023 at [facility] hospital with LVEF=50-55% with apical hypokinesis, no significant valve disease.  He was seen in our office in June 2024 by Dr. Shari Prows and described chest pains, insomnia, chronic pain and peripheral neuropathy. PET CT stress testing arranged but was denied by his insurance company.   He is here today for follow up. The patient denies any chest pain, dyspnea, palpitations, lower extremity edema, orthopnea, PND, dizziness, near syncope or syncope. 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 He still smokes over a pack a day. He is asking for something for his anxiety today  Primary Care Physician: Harvest Forest, MD   Past Medical History:  Diagnosis Date   Chronic pain 10/16/2014   On Narcotics @ home.   COPD (chronic obstructive pulmonary disease) (HCC)    Coronary artery disease involving native heart with unstable angina pectoris (HCC) 10/17/2014   LM ~45%, mRCA 99% (DES PCI), RPL3 70%, mLAD 50% & ostial D1 70%, OM1 75%   Eczema    Essential hypertension 10/17/2014   Hyperlipidemia with target LDL less than 70 10/16/2014   Inguinal hernia    Low back pain    Neck pain, chronic    Osteoarthritis    Peripheral neuropathic pain    Presence of DES in RCA : Synergy 4.0 mm x 28 mm (4.5 mm) 10/17/2014   ST elevation myocardial infarction  (STEMI) of inferior wall, initial episode of care (HCC) 10/16/2014   Tobacco abuse 10/16/2014   Urinary incontinence     Past Surgical History:  Procedure Laterality Date   CARDIAC CATHETERIZATION N/A 10/16/2014   Procedure: Left Heart Cath and Coronary Angiography;  Surgeon: Lyn Records, MD;  Location: [facility] INVASIVE CV LAB;  Service: Cardiovascular;  LM ~45%, mRCA 99% (DES PCI), RPL3 70%, mLAD 50% & ostial D1 70%, OM1 75%   CARDIAC CATHETERIZATION N/A 10/16/2014   Procedure: Coronary Stent Intervention;  Surgeon: Lyn Records, MD;  Location: [facility] INVASIVE CV LAB;  Service: Cardiovascular;  mRCA 99% --> Synergy DES 4.0 mm x 28 mm (4.5 mm)   knee pain     NEPHRECTOMY Left 2018   TRANSTHORACIC ECHOCARDIOGRAM      Current Outpatient Medications  Medication Sig Dispense Refill   albuterol (VENTOLIN HFA) 108 (90 Base) MCG/ACT inhaler Inhale 1 puff into the lungs every 4 (four) hours as needed for wheezing or shortness of breath.     atorvastatin (LIPITOR) 80 MG tablet Take 80 mg by mouth at bedtime.     bumetanide (BUMEX) 1 MG tablet Take 1 mg by mouth as needed.     clopidogrel (PLAVIX) 75 MG tablet TAKE ONE TABLET BY MOUTH DAILY (Patient taking differently: Take 75 mg by mouth at bedtime.) 90 tablet 2   fluticasone-salmeterol (ADVAIR) 250-50  MCG/ACT AEPB Inhale 1 puff into the lungs in the morning and at bedtime.     metoprolol succinate (TOPROL XL) 25 MG 24 hr tablet Take 1 tablet (25 mg total) by mouth daily. 90 tablet 3   nitroGLYCERIN (NITROSTAT) 0.4 MG SL tablet Place 1 tablet (0.4 mg total) under the tongue every 5 (five) minutes x 3 doses as needed for chest pain. 25 tablet 3   ondansetron (ZOFRAN) 8 MG tablet Take 8 mg by mouth every 8 (eight) hours as needed for nausea or vomiting.     oxyCODONE-acetaminophen (PERCOCET) 10-325 MG tablet Take 1 tablet by mouth See admin instructions. Six times a day     Polyvinyl Alcohol-Povidone PF (REFRESH) 1.4-0.6 % SOLN Place 1 drop into both eyes  daily as needed (Dry eyes).     pregabalin (LYRICA) 200 MG capsule Take 200 mg by mouth 3 (three) times daily.     torsemide (DEMADEX) 20 MG tablet Take 20 mg by mouth as needed.     No current facility-administered medications for this visit.    No Known Allergies  Social History   Socioeconomic History   Marital status: Single    Spouse name: Not on file   Number of children: Not on file   Years of education: Not on file   Highest education level: Not on file  Occupational History   Not on file  Tobacco Use   Smoking status: Every Day    Current packs/day: 1.50    Types: Cigarettes   Smokeless tobacco: Never   Tobacco comments:    2 ppd  Vaping Use   Vaping status: Never Used  Substance and Sexual Activity   Alcohol use: No    Alcohol/week: 0.0 standard drinks of alcohol   Drug use: No   Sexual activity: Not on file  Other Topics Concern   Not on file  Social History Narrative   Not on file   Social Determinants of Health   Financial Resource Strain: Not on file  Food Insecurity: Not on file  Transportation Needs: Not on file  Physical Activity: Not on file  Stress: Not on file  Social Connections: Not on file  Intimate Partner Violence: Not on file    Family History  Problem Relation Age of Onset   Heart attack Father 64   Hyperlipidemia Father    Hypertension Father    Healthy Daughter     Review of Systems:  As stated in the HPI and otherwise negative.   BP 118/70   Pulse 74   Ht 5\' 11"  (1.803 m)   Wt 87.5 kg   SpO2 94%   BMI 26.92 kg/m   Physical Examination: General: Well developed, well nourished, NAD  HEENT: OP clear, mucus membranes moist  SKIN: warm, dry. No rashes. Neuro: No focal deficits  Musculoskeletal: Muscle strength 5/5 all ext  Psychiatric: Mood and affect normal  Neck: No JVD, no carotid bruits, no thyromegaly, no lymphadenopathy.  Lungs:Clear bilaterally, no wheezes, rhonci, crackles Cardiovascular: Regular rate and  rhythm. No murmurs, gallops or rubs. Abdomen:Soft. Bowel sounds present. Non-tender.  Extremities: No lower extremity edema. Pulses are 2 + in the bilateral DP/PT.  EKG:  EKG is not ordered today. The ekg ordered today demonstrates   Recent Labs: 07/25/2022: ALT 20; BUN 10; Creatinine, Ser 0.79; Hemoglobin 13.4; Platelets 178; Potassium 3.6; Sodium 134   Lipid Panel    Component Value Date/Time   CHOL 106 08/24/2017 1243   TRIG 202 (H)  08/24/2017 1243   HDL 29 (L) 08/24/2017 1243   CHOLHDL 3.7 08/24/2017 1243   CHOLHDL 3.8 06/20/2015 1154   VLDL 33 (H) 06/20/2015 1154   LDLCALC 37 08/24/2017 1243     Wt Readings from Last 3 Encounters:  11/02/22 87.5 kg  07/25/22 90.7 kg  07/02/22 90.4 kg    Assessment and Plan:   1. CAD without angina: No chest pain suggestive of angina. LV function normal by echo in 2023 at [facility]. Continue Plavix, statin and beta blocker.   2. HTN: BP is well controlled. No changes  3. Hyperlipidemia: Lipids followed in primary care. Continue statin  4. Tobacco abuse: Smoking cessation is advised.   Labs/ tests ordered today include:  No orders of the defined types were placed in this encounter.  Disposition:   F/U with me in one year    Signed, Verne Carrow, MD, [facility] 11/02/2022 3:17 PM    [facility] [address] [place], [place], [place]  [address] Phone: [phone]; Fax: [phone]

## 2022-12-29 ENCOUNTER — Telehealth (HOSPITAL_COMMUNITY): Payer: Self-pay

## 2022-12-29 NOTE — Telephone Encounter (Signed)
Therapist receives a referral from [facility] psychiatrist and calls him.  Therapist verifies his identify by confirming two identifiers.  Damon Smith says his pain clinic "cut him off" and ask if this therapist could prescribe him pain medication. Therapist explains she is not a prescriber. Therapist inquires if he needs any type of mental health therapy, substance use therapy or psychiatry and he said unless this therapist could prescribe pain medication, there was nothing she could help with.  Damon Eisenmenger, MS, LMFT, LCAS 12-29-22

## 2023-02-26 ENCOUNTER — Ambulatory Visit: Payer: Self-pay | Admitting: Internal Medicine

## 2023-02-26 NOTE — Telephone Encounter (Signed)
 Copied from CRM [id number]. Topic: Appointments - Appointment Scheduling >> Feb 26, 2023  4:45 PM Jinnie BROCKS wrote: Patient/patient representative is calling to schedule an appointment. Refer to attachments for appointment information. Patient tested positive for colon cancer and was referred by Dr. Roanna. Needs to speak with someone

## 2023-02-26 NOTE — Telephone Encounter (Addendum)
 Upon further investigation, patient is established at [facility]. Advised agent to provide patient phone number [phone]) for further assistance.   Copied from CRM [id number]. Topic: Referral - Status >> Feb 26, 2023  4:43 PM Jinnie BROCKS wrote: Reason for CRM: Patient is calling for referral status
